# Patient Record
Sex: Male | Born: 1940 | Race: White | Hispanic: No | Marital: Married | Smoking: Former smoker
Health system: Southern US, Community
[De-identification: ages and names within clinical notes are randomized; demographics above are authoritative.]

## PROBLEM LIST (undated history)

## (undated) DIAGNOSIS — E785 Hyperlipidemia, unspecified: Secondary | ICD-10-CM

## (undated) DIAGNOSIS — R55 Syncope and collapse: Secondary | ICD-10-CM

## (undated) DIAGNOSIS — G4733 Obstructive sleep apnea (adult) (pediatric): Secondary | ICD-10-CM

## (undated) HISTORY — DX: Obstructive sleep apnea (adult) (pediatric): G47.33

## (undated) HISTORY — DX: Hyperlipidemia, unspecified: E78.5

## (undated) HISTORY — DX: Syncope and collapse: R55

---

## 1999-07-22 ENCOUNTER — Emergency Department (HOSPITAL_COMMUNITY): Admission: EM | Admit: 1999-07-22 | Discharge: 1999-07-22 | Payer: Self-pay | Admitting: Emergency Medicine

## 1999-07-22 ENCOUNTER — Encounter: Payer: Self-pay | Admitting: Cardiology

## 2000-12-26 ENCOUNTER — Encounter: Payer: Self-pay | Admitting: Family Medicine

## 2000-12-26 ENCOUNTER — Encounter: Admission: RE | Admit: 2000-12-26 | Discharge: 2000-12-26 | Payer: Self-pay | Admitting: Family Medicine

## 2002-01-04 ENCOUNTER — Emergency Department (HOSPITAL_COMMUNITY): Admission: EM | Admit: 2002-01-04 | Discharge: 2002-01-04 | Payer: Self-pay | Admitting: Emergency Medicine

## 2010-01-21 ENCOUNTER — Inpatient Hospital Stay (HOSPITAL_COMMUNITY)
Admission: EM | Admit: 2010-01-21 | Discharge: 2010-01-22 | Payer: Self-pay | Source: Home / Self Care | Attending: Cardiovascular Disease | Admitting: Cardiovascular Disease

## 2010-01-26 LAB — CBC
HCT: 41.4 % (ref 39.0–52.0)
HCT: 38.4 % — ABNORMAL LOW (ref 39.0–52.0)
Hemoglobin: 13.5 g/dL (ref 13.0–17.0)
Hemoglobin: 12.3 g/dL — ABNORMAL LOW (ref 13.0–17.0)
MCH: 25.5 pg — ABNORMAL LOW (ref 26.0–34.0)
MCH: 25.1 pg — ABNORMAL LOW (ref 26.0–34.0)
MCHC: 32.6 g/dL (ref 30.0–36.0)
MCHC: 32 g/dL (ref 30.0–36.0)
MCV: 78.3 fL (ref 78.0–100.0)
MCV: 78.4 fL (ref 78.0–100.0)
Platelets: 244 10*3/uL (ref 150–400)
RBC: 5.29 MIL/uL (ref 4.22–5.81)
RDW: 13.8 % (ref 11.5–15.5)
RDW: 13.9 % (ref 11.5–15.5)
WBC: 6.9 10*3/uL (ref 4.0–10.5)

## 2010-01-26 LAB — CARDIAC PANEL(CRET KIN+CKTOT+MB+TROPI)
Relative Index: INVALID (ref 0.0–2.5)
Troponin I: 0.01 ng/mL (ref 0.00–0.06)

## 2010-01-26 LAB — CK TOTAL AND CKMB (NOT AT ARMC)
CK, MB: 2.8 ng/mL (ref 0.3–4.0)
Relative Index: INVALID (ref 0.0–2.5)
Total CK: 57 U/L (ref 7–232)

## 2010-01-26 LAB — DIFFERENTIAL
Basophils Absolute: 0 10*3/uL (ref 0.0–0.1)
Basophils Relative: 1 % (ref 0–1)
Eosinophils Absolute: 0.2 10*3/uL (ref 0.0–0.7)
Eosinophils Relative: 4 % (ref 0–5)
Lymphocytes Relative: 41 % (ref 12–46)
Lymphs Abs: 2.8 10*3/uL (ref 0.7–4.0)
Monocytes Absolute: 0.4 10*3/uL (ref 0.1–1.0)
Monocytes Relative: 6 % (ref 3–12)
Neutro Abs: 3.4 10*3/uL (ref 1.7–7.7)
Neutrophils Relative %: 49 % (ref 43–77)

## 2010-01-26 LAB — POCT I-STAT, CHEM 8
BUN: 25 mg/dL — ABNORMAL HIGH (ref 6–23)
Calcium, Ion: 1.01 mmol/L — ABNORMAL LOW (ref 1.12–1.32)
Chloride: 101 mEq/L (ref 96–112)
Creatinine, Ser: 1.1 mg/dL (ref 0.4–1.5)
Glucose, Bld: 103 mg/dL — ABNORMAL HIGH (ref 70–99)
HCT: 42 % (ref 39.0–52.0)
Hemoglobin: 14.3 g/dL (ref 13.0–17.0)
Potassium: 4.1 mEq/L (ref 3.5–5.1)
Sodium: 138 mEq/L (ref 135–145)
TCO2: 31 mmol/L (ref 0–100)

## 2010-01-26 LAB — HEMOGLOBIN A1C
Hgb A1c MFr Bld: 6.3 % — ABNORMAL HIGH (ref <5.7)
Mean Plasma Glucose: 134 mg/dL — ABNORMAL HIGH (ref <117)

## 2010-01-26 LAB — BRAIN NATRIURETIC PEPTIDE: Pro B Natriuretic peptide (BNP): <30 pg/mL (ref 0.0–100.0)

## 2010-01-26 LAB — POCT CARDIAC MARKERS
CKMB, poc: 1.3 ng/mL (ref 1.0–8.0)
Troponin i, poc: <0.05 ng/mL (ref 0.00–0.09)

## 2010-01-26 LAB — LIPID PANEL
Triglycerides: 97 mg/dL (ref <150)
VLDL: 19 mg/dL (ref 0–40)

## 2010-01-26 LAB — PROTIME-INR
INR: 0.88 (ref 0.00–1.49)
Prothrombin Time: 12.1 seconds (ref 11.6–15.2)

## 2010-01-26 LAB — TSH: TSH: 3.404 u[IU]/mL (ref 0.350–4.500)

## 2010-01-26 LAB — TROPONIN I: Troponin I: 0.01 ng/mL (ref 0.00–0.06)

## 2010-02-04 NOTE — Discharge Summary (Signed)
  Patrick Carroll, Patrick Carroll              ACCOUNT NO.:  [id number]  MEDICAL RECORD NO.:  [id number]          PATIENT TYPE:  INP  LOCATION:  3704                         FACILITY:  [facility]  PHYSICIAN:  Nicki Guadalajara, M.D.     DATE OF BIRTH:  18-Sep-1940  DATE OF ADMISSION:  01/21/2010 DATE OF DISCHARGE:  01/22/2010                              DISCHARGE SUMMARY   DISCHARGE DIAGNOSES: 1. Chest pain, negative myocardial infarction.     a.     Normal nuclear stress test, Lexiscan, Myoview with ejection      fraction of 65%. 2. Dyslipidemia. 3. Hypertension, treated and controlled. 4. Gastroesophageal reflux disease.  DISCHARGE CONDITION:  Improved.  PROCEDURES:  None.  DISCHARGE INSTRUCTIONS:  Activity as tolerated.  Heart-healthy diet. Follow up with Dr. Julieanne Manson, [facility] in 3-4 weeks, February 12, 2010, at 8:45 a.m.  DISCHARGE MEDICATIONS:  See medication reconciliation sheet, but nochanges were made except he has sublingual nitro as needed.  HOSPITAL COURSE:  A 70 year old white married male whose wife is a patient of Dr. Fredirick Maudlin, primary care patient of Dr. Marjory Lies, presented to [facility] Emergency Room with new exertional chest pain. Pressure that awoke him at 3 a.m. on the day of admission, so not only exertional, but also rest pain.  It radiated to his jaw, describes 1- 2/10 pain, lasted 20 minutes.  No associated nausea, shortness of breath, or diaphoresis.  The patient was brought in and ruled out for MI.  Stress test was ordered and he underwent without complications and was negative for ischemia.  Dr. Clarene Duke saw him after the stress test, felt he was stable for discharge.  LABORATORY DATA:  Hemoglobin 12.3, hematocrit 38.4, WBC 7.5, and platelets 217.  Protime 12.1, INR 0.88.  Sodium 138, potassium 4.1, chloride 101, glucose 103, BUN 25, creatinine 1.1.  Hemoglobin A1c 6.3. CK 31-57, MB 1.5, troponin I 0.01-0.02, myoglobin 48.7.  BNP less  than 30.  Total cholesterol 173, triglycerides 97, HDL 51, LDL 103.  TSH 3.404.  RADIOLOGY:  Chest x-ray; no acute cardiopulmonary process, well- appearing hilar linear atelectasis on the left on nuclear stress test. No reversible ischemia or infarction, normal wall motion, EF 65%.  EKG; sinus rhythm, left axis deviation, no acute changes.  The patient is stable and ready for discharge and will follow up as instructed.     Darcella Gasman. Annie Paras, N.P.   ______________________________ Nicki Guadalajara, M.D.    LRI/MEDQ  D:  01/22/2010  T:  01/23/2010  Job:  [id number]  cc:   Marjory Lies, M.D.  Electronically Signed by Nada Boozer N.P. on 01/23/2010 07:09:16 PM Electronically Signed by Nicki Guadalajara M.D. on 02/04/2010 02:38:30 PM

## 2011-06-02 ENCOUNTER — Emergency Department (HOSPITAL_COMMUNITY): Payer: Medicare Other

## 2011-06-02 ENCOUNTER — Encounter (HOSPITAL_COMMUNITY): Payer: Self-pay | Admitting: Radiology

## 2011-06-02 ENCOUNTER — Emergency Department (HOSPITAL_COMMUNITY)
Admission: EM | Admit: 2011-06-02 | Discharge: 2011-06-02 | Disposition: A | Discharge status: Home / Self Care | Payer: Medicare Other | Attending: Emergency Medicine | Admitting: Emergency Medicine

## 2011-06-02 DIAGNOSIS — R110 Nausea: Secondary | ICD-10-CM | POA: Insufficient documentation

## 2011-06-02 DIAGNOSIS — R55 Syncope and collapse: Secondary | ICD-10-CM

## 2011-06-02 DIAGNOSIS — Z79899 Other long term (current) drug therapy: Secondary | ICD-10-CM | POA: Insufficient documentation

## 2011-06-02 DIAGNOSIS — J3489 Other specified disorders of nose and nasal sinuses: Secondary | ICD-10-CM | POA: Insufficient documentation

## 2011-06-02 DIAGNOSIS — R42 Dizziness and giddiness: Secondary | ICD-10-CM | POA: Insufficient documentation

## 2011-06-02 DIAGNOSIS — R05 Cough: Secondary | ICD-10-CM | POA: Insufficient documentation

## 2011-06-02 DIAGNOSIS — R5381 Other malaise: Secondary | ICD-10-CM | POA: Insufficient documentation

## 2011-06-02 DIAGNOSIS — R509 Fever, unspecified: Secondary | ICD-10-CM | POA: Insufficient documentation

## 2011-06-02 DIAGNOSIS — R059 Cough, unspecified: Secondary | ICD-10-CM | POA: Insufficient documentation

## 2011-06-02 DIAGNOSIS — Z7982 Long term (current) use of aspirin: Secondary | ICD-10-CM | POA: Insufficient documentation

## 2011-06-02 DIAGNOSIS — R231 Pallor: Secondary | ICD-10-CM | POA: Insufficient documentation

## 2011-06-02 DIAGNOSIS — R61 Generalized hyperhidrosis: Secondary | ICD-10-CM | POA: Insufficient documentation

## 2011-06-02 DIAGNOSIS — R109 Unspecified abdominal pain: Secondary | ICD-10-CM | POA: Insufficient documentation

## 2011-06-02 DIAGNOSIS — R404 Transient alteration of awareness: Secondary | ICD-10-CM | POA: Insufficient documentation

## 2011-06-02 LAB — COMPREHENSIVE METABOLIC PANEL
AST: 16 U/L (ref 0–37)
Albumin: 3 g/dL — ABNORMAL LOW (ref 3.5–5.2)
Chloride: 100 mEq/L (ref 96–112)
Creatinine, Ser: 1.15 mg/dL (ref 0.50–1.35)
Total Bilirubin: 0.3 mg/dL (ref 0.3–1.2)
Total Protein: 5.8 g/dL — ABNORMAL LOW (ref 6.0–8.3)

## 2011-06-02 LAB — URINALYSIS, ROUTINE W REFLEX MICROSCOPIC
Glucose, UA: NEGATIVE mg/dL
Specific Gravity, Urine: 1.024 (ref 1.005–1.030)
pH: 6.5 (ref 5.0–8.0)

## 2011-06-02 LAB — CBC
Hemoglobin: 10.3 g/dL — ABNORMAL LOW (ref 13.0–17.0)
MCH: 24.6 pg — ABNORMAL LOW (ref 26.0–34.0)
MCV: 76.8 fL — ABNORMAL LOW (ref 78.0–100.0)
Platelets: 200 10*3/uL (ref 150–400)
RBC: 4.18 MIL/uL — ABNORMAL LOW (ref 4.22–5.81)
WBC: 10.6 10*3/uL — ABNORMAL HIGH (ref 4.0–10.5)

## 2011-06-02 LAB — URINE MICROSCOPIC-ADD ON

## 2011-06-02 LAB — DIFFERENTIAL
Basophils Absolute: 0 10*3/uL (ref 0.0–0.1)
Basophils Relative: 0 % (ref 0–1)
Monocytes Absolute: 0.4 10*3/uL (ref 0.1–1.0)
Neutro Abs: 9.5 10*3/uL — ABNORMAL HIGH (ref 1.7–7.7)
Neutrophils Relative %: 90 % — ABNORMAL HIGH (ref 43–77)

## 2011-06-02 MED ORDER — MAGNESIUM CITRATE PO SOLN
1.0000 | Freq: Once | ORAL | Status: AC
  Administered 2011-06-02: 1 via ORAL
  Filled 2011-06-02: qty 296

## 2011-06-02 MED ORDER — SODIUM CHLORIDE 0.9 % IV BOLUS (SEPSIS)
1000.0000 mL | Freq: Once | INTRAVENOUS | Status: AC
  Administered 2011-06-02: 1000 mL via INTRAVENOUS

## 2011-06-02 MED ORDER — IOHEXOL 300 MG/ML  SOLN
20.0000 mL | INTRAMUSCULAR | Status: AC
  Administered 2011-06-02 (×2): 20 mL via ORAL

## 2011-06-02 MED ORDER — IOHEXOL 300 MG/ML  SOLN
100.0000 mL | Freq: Once | INTRAMUSCULAR | Status: AC | PRN
  Administered 2011-06-02: 100 mL via INTRAVENOUS

## 2011-06-02 MED ORDER — ONDANSETRON HCL 4 MG/2ML IJ SOLN
INTRAMUSCULAR | Status: AC
  Filled 2011-06-02: qty 4

## 2011-06-02 NOTE — ED Provider Notes (Signed)
History     CSN: [id number]  Arrival date & time 06/02/11  1553   First MD Initiated Contact with Patient 06/02/11 1607      Chief Complaint  Patient presents with  . 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  He states they told him he was out less than a minute.  He reports that he then began to feel nauseous and diaphoretic.  He states that even in the ambulance he continued with the nausea.  He reports now he has crampy lower abdominal pain.  He currently denies nausea.  He states no headache, chest pain, shortness of breath, diarrhea, vomiting.  He states that this weekend he had a head cold that then went into his chest with yellow sputum production, fever and cough.  He reports that he began feeling better from this on Monday.  He states no other symptoms with this.  Patient is a 71 y.o. male presenting with syncope. The history is provided by the patient and the spouse. No language interpreter was used.  Loss of Consciousness This is a new problem. The current episode started today. The problem occurs intermittently. The problem has been resolved. Associated symptoms include abdominal pain, congestion, coughing, diaphoresis, a fever, nausea and weakness. Pertinent negatives include no anorexia, arthralgias, change in bowel habit, chest pain, chills, fatigue, headaches, joint swelling, myalgias, neck pain, numbness, rash, sore throat, swollen glands, urinary symptoms, vertigo, visual change or vomiting. The symptoms are aggravated by nothing. He has tried rest and lying down for the symptoms. The treatment  provided moderate relief.    No past medical history on file.  No past surgical history on file.  No family history on file.  History  Substance Use Topics  . Smoking status: Not on file  . Smokeless tobacco: Not on file  . Alcohol Use: Not on file      Review of Systems  Constitutional: Positive for fever and diaphoresis. Negative for chills and fatigue.  HENT: Positive for congestion. Negative for sore throat and neck pain.   Respiratory: Positive for cough.   Cardiovascular: Positive for syncope. Negative for chest pain.  Gastrointestinal: Positive for nausea and abdominal pain. Negative for vomiting, anorexia and change in bowel habit.  Musculoskeletal: Negative for myalgias, joint swelling and arthralgias.  Skin: Negative for rash.  Neurological: Positive for weakness. Negative for vertigo, numbness and headaches.  All other systems reviewed and are negative.    Allergies  Penicillins  Home Medications   Current Outpatient Rx  Name Route Sig Dispense Refill  . ASPIRIN 81 MG PO CHEW Oral Chew 81 mg by mouth daily.    . B COMPLEX-C PO TABS Oral Take 1 tablet by mouth every other day.    Marland Kitchen CANDESARTAN CILEXETIL 16 MG PO TABS Oral Take 16 mg by mouth daily.    . ADULT MULTIVITAMIN W/MINERALS CH Oral Take 1 tablet by mouth daily.    Marland Kitchen PANTOPRAZOLE SODIUM 40 MG PO TBEC Oral Take 40 mg by mouth 2 (two) times daily.    Marland Kitchen PHENYLEPH-SHARK LIV OIL-MO-PET 0.25-3-14-71.9 % RE OINT Rectal Place 1 application rectally 2 (two)  times daily as needed. For discomfort    . PRAVASTATIN SODIUM 40 MG PO TABS Oral Take 40 mg by mouth daily.      BP 104/63  Pulse 65  Temp 97.7 F (36.5 C)  Resp 17  SpO2 96%  Physical Exam  Nursing note and vitals reviewed. Constitutional: He is oriented to person, place, and time. He appears well-developed and well-nourished. No distress.  HENT:  Head: Normocephalic and atraumatic.  Right Ear: External ear normal.  Left Ear: External ear  normal.  Nose: Nose normal.  Mouth/Throat: Oropharynx is clear and moist. No oropharyngeal exudate.  Eyes: Conjunctivae are normal. Pupils are equal, round, and reactive to light. No scleral icterus.  Neck: Normal range of motion. Neck supple. No JVD present. No tracheal deviation present.  Cardiovascular: Normal rate, regular rhythm, normal heart sounds and intact distal pulses.  Exam reveals no gallop and no friction rub.   No murmur heard. Pulmonary/Chest: Effort normal and breath sounds normal. No respiratory distress. He has no wheezes. He has no rales. He exhibits no tenderness.  Abdominal: Soft. Bowel sounds are normal. He exhibits no distension and no mass. There is tenderness in the suprapubic area. There is no rebound and no guarding.    Musculoskeletal: Normal range of motion. He exhibits no edema and no tenderness.  Neurological: He is alert and oriented to person, place, and time. No cranial nerve deficit. He exhibits normal muscle tone. Coordination normal.  Skin: Skin is warm and dry. No rash noted. No erythema. There is pallor.  Psychiatric: He has a normal mood and affect. His behavior is normal. Judgment and thought content normal.    ED Course  Procedures (including critical care time)   Labs Reviewed  CBC  DIFFERENTIAL  COMPREHENSIVE METABOLIC PANEL  LIPASE, BLOOD  URINALYSIS, ROUTINE W REFLEX MICROSCOPIC   No results found.  Results for orders placed during the hospital encounter of 06/02/11  CBC      Component Value Range   WBC 10.6 (*) 4.0 - 10.5 (K/uL)   RBC 4.18 (*) 4.22 - 5.81 (MIL/uL)   Hemoglobin 10.3 (*) 13.0 - 17.0 (g/dL)   HCT 16.1 (*) 09.6 - 52.0 (%)   MCV 76.8 (*) 78.0 - 100.0 (fL)   MCH 24.6 (*) 26.0 - 34.0 (pg)   MCHC 32.1  30.0 - 36.0 (g/dL)   RDW 04.5  40.9 - 81.1 (%)   Platelets 200  150 - 400 (K/uL)  DIFFERENTIAL      Component Value Range   Neutrophils Relative 90 (*) 43 - 77 (%)   Neutro Abs 9.5 (*) 1.7 - 7.7 (K/uL)   Lymphocytes  Relative 6 (*) 12 - 46 (%)   Lymphs Abs 0.6 (*) 0.7 - 4.0 (K/uL)   Monocytes Relative 4  3 - 12 (%)   Monocytes Absolute 0.4  0.1 - 1.0 (K/uL)   Eosinophils Relative 1  0 - 5 (%)   Eosinophils Absolute 0.1  0.0 - 0.7 (K/uL)   Basophils Relative 0  0 - 1 (%)   Basophils Absolute 0.0  0.0 - 0.1 (K/uL)  COMPREHENSIVE METABOLIC PANEL      Component Value Range   Sodium 136  135 - 145 (mEq/L)   Potassium 4.4  3.5 - 5.1 (mEq/L)   Chloride 100  96 - 112 (mEq/L)   CO2 25  19 - 32 (mEq/L)   Glucose, Bld 116 (*) 70 - 99 (mg/dL)   BUN 18  6 - 23 (mg/dL)  Creatinine, Ser 1.15  0.50 - 1.35 (mg/dL)   Calcium 7.6 (*) 8.4 - 10.5 (mg/dL)   Total Protein 5.8 (*) 6.0 - 8.3 (g/dL)   Albumin 3.0 (*) 3.5 - 5.2 (g/dL)   AST 16  0 - 37 (U/L)   ALT 14  0 - 53 (U/L)   Alkaline Phosphatase 67  39 - 117 (U/L)   Total Bilirubin 0.3  0.3 - 1.2 (mg/dL)   GFR calc non Af Amer 63 (*) >90 (mL/min)   GFR calc Af Amer 73 (*) >90 (mL/min)  LIPASE, BLOOD      Component Value Range   Lipase 19  11 - 59 (U/L)  URINALYSIS, ROUTINE W REFLEX MICROSCOPIC      Component Value Range   Color, Urine AMBER (*) YELLOW    APPearance HAZY (*) CLEAR    Specific Gravity, Urine 1.024  1.005 - 1.030    pH 6.5  5.0 - 8.0    Glucose, UA NEGATIVE  NEGATIVE (mg/dL)   Hgb urine dipstick NEGATIVE  NEGATIVE    Bilirubin Urine SMALL (*) NEGATIVE    Ketones, ur 15 (*) NEGATIVE (mg/dL)   Protein, ur NEGATIVE  NEGATIVE (mg/dL)   Urobilinogen, UA 1.0  0.0 - 1.0 (mg/dL)   Nitrite NEGATIVE  NEGATIVE    Leukocytes, UA SMALL (*) NEGATIVE   URINE MICROSCOPIC-ADD ON      Component Value Range   Squamous Epithelial / LPF RARE  RARE    WBC, UA 0-2  <3 (WBC/hpf)   Bacteria, UA RARE  RARE    Casts HYALINE CASTS (*) NEGATIVE    Crystals CA OXALATE CRYSTALS (*) NEGATIVE    Urine-Other MUCOUS PRESENT    POCT I-STAT TROPONIN I      Component Value Range   Troponin i, poc 0.01  0.00 - 0.08 (ng/mL)   Comment 3            Dg Chest 2  View  06/02/2011  *RADIOLOGY REPORT*  Clinical Data: Loss of consciousness today.  Nausea.  Recent upper respiratory infection.  CHEST - 2 VIEW  Comparison: Portable chest 01/21/2010.  Findings: Left hemidiaphragm is slightly elevated.  The heart size is normal.  The lungs are clear.  Mild gaseous distention is noted involving both large and small bowel.  IMPRESSION:  1.  No acute cardiopulmonary disease. 2.  Nonspecific bowel dilation of large and small bowel.  Question ileus.  Original Report Authenticated By: Jamesetta Orleans. MATTERN, M.D.   Ct Abdomen Pelvis W Contrast  06/02/2011  *RADIOLOGY REPORT*  Clinical Data: Fever, nausea, diarrhea.  CT ABDOMEN AND PELVIS WITH CONTRAST  Technique:  Multidetector CT imaging of the abdomen and pelvis was performed following the standard protocol during bolus administration of intravenous contrast.  Contrast: OMNIPAQUE IOHEXOL 300 MG/ML  SOLN  Comparison: Chest x-ray 06/02/2011  Findings: Mild scarring at the lung bases without effusion or infiltrate.  Unremarkable liver, spleen, gallbladder, pancreas, and adrenal glands.  Marked gastric distention consistent with ileus. No evidence for gastric obstruction.  Moderately large stool burden in the right colon with nonobstructive ileus of the redundant transverse and descending colon.  Sigmoid colon and rectum are compressed.  No small bowel distention or obstruction.  Normal kidneys.  Atheromatous change of the aorta is nonaneurysmal. No retroperitoneal adenopathy.  No appendiceal inflammation.  No free fluid or free air.  No acute osseous findings.  Mild scoliosis.  Mild degenerative disc disease in the lower lumbar spine.  Normal bladder.  Atrophic uterus.  No adnexal masses.  IMPRESSION: Findings consistent with gastric and colonic ileus.  Moderate stool burden suggesting constipation. No significant obstruction or visible free air.  Original Report Authenticated By: Elsie Stain, M.D.     Vasovagal  Syncope   MDM  5:35 PM Patient with noted dilitation of small and large bowel on x-ray, because of this as well as the nausea and diarrhea here, the plan will be to get a CT scan of the abdomen here to rule out an ileus.  10:04 PM CT results returned - patient with gastric and colonic ileus likely related to chronic constipation.  I believe that with the dilitation, the patient likely vasovagal syncope.  Sees Dr. Matthias Hughs with GI, he will follow up with him, will be given bottle of magnesium citrate solution here.       Izola Price [place], [place] 06/02/11 2206

## 2011-06-02 NOTE — ED Notes (Signed)
Called radiology to inform them that patient has finished drinking contrast

## 2011-06-02 NOTE — ED Notes (Signed)
Patient transported to X-ray 

## 2011-06-02 NOTE — ED Notes (Signed)
Patient transported to CT 

## 2011-06-02 NOTE — Discharge Instructions (Signed)
Syncope You have had a fainting (syncopal) spell. A fainting episode is a sudden, short-lived loss of consciousness. It results in complete recovery. It occurs because there has been a temporary shortage of oxygen and/or sugar (glucose) to the brain. CAUSES   Blood pressure pills and other medications that may lower blood pressure below normal. Sudden changes in posture (sudden standing).   Over-medication. Take your medications as directed.   Standing too long. This can cause blood to pool in the legs.   Seizure disorders.   Low blood sugar (hypoglycemia) of diabetes. This more commonly causes coma.   Bearing down to go to the bathroom. This can cause your blood pressure to rise suddenly. Your body compensates by making the blood pressure too low when you stop bearing down.   Hardening of the arteries where the brain temporarily does not receive enough blood.   Irregular heart beat and circulatory problems.   Fear, emotional distress, injury, sight of blood, or illness.  Your caregiver will send you home if the syncope was from non-worrisome causes (benign). Depending on your age and health, you may stay to be monitored and observed. If you return home, have someone stay with you if your caregiver feels that is desirable. It is very important to keep all follow-up referrals and appointments in order to properly manage this condition. This is a serious problem which can lead to serious illness and death if not carefully managed.  WARNING: Do not drive or operate machinery until your caregiver feels that it is safe for you to do so. SEEK IMMEDIATE MEDICAL CARE IF:   You have another fainting episode or faint while lying or sitting down. DO NOT DRIVE YOURSELF. Call 911 if no other help is available.   You have chest pain, are feeling sick to your stomach (nausea), vomiting or abdominal pain.   You have an irregular heartbeat or one that is very fast (pulse over 120 beats per minute).    You have a loss of feeling in some part of your body or lose movement in your arms or legs.   You have difficulty with speech, confusion, severe weakness, or visual problems.   You become sweaty and/or feel light headed.  Make sure you are rechecked as instructed. Document Released: 12/21/2004 Document Revised: 12/10/2010 Document Reviewed: 08/11/2006 [employer] Patient Information 2012 [employer]. 

## 2011-06-02 NOTE — ED Provider Notes (Signed)
5:26 PM  Date: 06/02/2011  Rate: 62  Rhythm: normal sinus rhythm  QRS Axis: left  Intervals: normal QRS:  Early precordial R./S. transition; poor R wave progression in the lateral precordial leads suggests possible old lateral myocardial infarction.  ST/T Wave abnormalities: normal  Conduction Disutrbances:none  Narrative Interpretation:  Abnormal EKG  Old EKG Reviewed: unchanged    Carleene Cooper III, MD 06/02/11 1728

## 2011-06-02 NOTE — ED Notes (Signed)
PA at bedside.

## 2011-06-02 NOTE — ED Notes (Signed)
Pt just returned from CT.

## 2011-06-02 NOTE — ED Notes (Signed)
Pharmacy notified for mag citrate

## 2011-06-02 NOTE — ED Notes (Signed)
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 Pt then had a syncopal episode. Pt did not hit head or fall. Pt has been nauseated and diaphoretic. Pt received 8mg  of zofran enroute. Pt has been congested, with yellow sputum for about 1 week. Pt reported a fever that broke on Monday night. Pt received of normal saline. 18g in left forearm. BP 102/60. HR 60. CBG 112.

## 2011-06-04 NOTE — ED Provider Notes (Signed)
Medical screening examination/treatment/procedure(s) were conducted as a shared visit with non-physician practitioner(s) and myself.  I personally evaluated the patient during the encounter Mr. 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  His exam was normal.  He had a lot of bowel gas on his chest x-ray and so CT of abdomen/pelvis was done.  Fortunately, no serious finding was encountered. He had fully recovered from his fainting spell at the time of discharge.  Carleene Cooper III, MD 06/04/11 1055

## 2011-07-06 ENCOUNTER — Other Ambulatory Visit: Payer: Self-pay | Admitting: Gastroenterology

## 2011-07-06 DIAGNOSIS — Z1211 Encounter for screening for malignant neoplasm of colon: Secondary | ICD-10-CM

## 2011-07-07 ENCOUNTER — Ambulatory Visit
Admission: RE | Admit: 2011-07-07 | Discharge: 2011-07-07 | Disposition: A | Discharge status: Home / Self Care | Payer: Medicare Other | Source: Ambulatory Visit | Attending: Gastroenterology | Admitting: Gastroenterology

## 2011-07-07 DIAGNOSIS — Z1211 Encounter for screening for malignant neoplasm of colon: Secondary | ICD-10-CM

## 2016-05-10 ENCOUNTER — Emergency Department (HOSPITAL_COMMUNITY)
Admission: EM | Admit: 2016-05-10 | Discharge: 2016-05-10 | Disposition: A | Discharge status: Home / Self Care | Payer: Medicare Other | Attending: Emergency Medicine | Admitting: Emergency Medicine

## 2016-05-10 DIAGNOSIS — Z7982 Long term (current) use of aspirin: Secondary | ICD-10-CM | POA: Insufficient documentation

## 2016-05-10 DIAGNOSIS — M25511 Pain in right shoulder: Secondary | ICD-10-CM | POA: Diagnosis not present

## 2016-05-10 DIAGNOSIS — R55 Syncope and collapse: Secondary | ICD-10-CM | POA: Diagnosis present

## 2016-05-10 LAB — CBC
HCT: 39.4 % (ref 39.0–52.0)
HEMOGLOBIN: 12.8 g/dL — AB (ref 13.0–17.0)
MCH: 26.4 pg (ref 26.0–34.0)
MCHC: 32.5 g/dL (ref 30.0–36.0)
MCV: 81.2 fL (ref 78.0–100.0)
PLATELETS: 184 10*3/uL (ref 150–400)
RBC: 4.85 MIL/uL (ref 4.22–5.81)
RDW: 13.8 % (ref 11.5–15.5)
WBC: 7.1 10*3/uL (ref 4.0–10.5)

## 2016-05-10 LAB — URINALYSIS, ROUTINE W REFLEX MICROSCOPIC
BILIRUBIN URINE: NEGATIVE
GLUCOSE, UA: NEGATIVE mg/dL
HGB URINE DIPSTICK: NEGATIVE
Ketones, ur: NEGATIVE mg/dL
Leukocytes, UA: NEGATIVE
Nitrite: NEGATIVE
Protein, ur: NEGATIVE mg/dL
SPECIFIC GRAVITY, URINE: 1.016 (ref 1.005–1.030)
pH: 7 (ref 5.0–8.0)

## 2016-05-10 LAB — BASIC METABOLIC PANEL
Anion gap: 7 (ref 5–15)
BUN: 15 mg/dL (ref 6–20)
CALCIUM: 8.7 mg/dL — AB (ref 8.9–10.3)
CO2: 30 mmol/L (ref 22–32)
CREATININE: 0.89 mg/dL (ref 0.61–1.24)
Chloride: 103 mmol/L (ref 101–111)
GFR calc Af Amer: >60 mL/min (ref >60)
GFR calc non Af Amer: >60 mL/min (ref >60)
GLUCOSE: 105 mg/dL — AB (ref 65–99)
Potassium: 4.2 mmol/L (ref 3.5–5.1)
Sodium: 140 mmol/L (ref 135–145)

## 2016-05-10 LAB — TROPONIN I: Troponin I: <0.03 ng/mL (ref <0.03)

## 2016-05-10 MED ORDER — IBUPROFEN 200 MG PO TABS
600.0000 mg | ORAL_TABLET | Freq: Once | ORAL | Status: AC
  Administered 2016-05-10: 600 mg via ORAL
  Filled 2016-05-10: qty 1

## 2016-05-10 NOTE — ED Provider Notes (Signed)
[facility] DEPT Provider Note   CSN: [id number] Arrival date & time: 05/10/16  0810     History   Chief Complaint Chief Complaint  Patient presents with  . Near Syncope    HPI Patrick Carroll is a 76 y.o. male.  Patient reports he has been working outdoors in the heat the last couple of days. Today had not yet had breakfast, was doing some chores around the house. Sat down on the couch. Had onset of R shoulder pain, nausea, diaphoresis, lightheaded. No HA or vision changes. No CP or SOB. No palpitations. Denies recent f/c or infectious sx. No n/v/d. No melena or hematochezia. Wife is a retired [employer] scientist. She was able to get vitals on the patient - SBP 70s, HR 40s. Laid the patient down. Resolved after approx 10 minutes. EMS called. FS 150s. Now all symptoms completely resolved.  Of note, patient has had multiple prior vagal episodes in the past with similar course.    The history is provided by the patient and the spouse.  Near Syncope  This is a new problem. The current episode started less than 1 hour ago. The problem occurs rarely. The problem has been resolved. Pertinent negatives include no chest pain, no headaches and no shortness of breath. Nothing aggravates the symptoms. The symptoms are relieved by lying down.    No past medical history on file.  There are no active problems to display for this patient.   No past surgical history on file.     Home Medications    Prior to Admission medications   Medication Sig Start Date End Date Taking? Authorizing Provider  aspirin 81 MG chewable tablet Chew 81 mg by mouth daily.   Yes [provider]  B Complex-C (B-COMPLEX WITH VITAMIN C) tablet Take 1 tablet by mouth every other day.   Yes [provider]  Multiple Vitamin (MULITIVITAMIN WITH MINERALS) TABS Take 1 tablet by mouth daily.   Yes [provider]  tadalafil (CIALIS) 5 MG tablet Take 2.5 mg by mouth every other day.   Yes [provider]    Family History No family history on file.  Social History Social History  Substance Use Topics  . Smoking status: Not on file  . Smokeless tobacco: Not on file  . Alcohol use Not on file     Allergies   Penicillins   Review of Systems Review of Systems  Constitutional: Negative for chills and fever.  Eyes: Negative for visual disturbance.  Respiratory: Negative for cough and shortness of breath.   Cardiovascular: Positive for near-syncope. Negative for chest pain and palpitations.  Gastrointestinal: Positive for nausea. Negative for blood in stool, diarrhea and vomiting.  Genitourinary: Negative for dysuria.  Musculoskeletal: Negative for back pain.  Neurological: Negative for headaches.  All other systems reviewed and are negative.    Physical Exam Updated Vital Signs BP 110/67   Pulse 63   Temp 97.6 F (36.4 C) (Oral)   Resp 16   SpO2 96%   Physical Exam  Constitutional: He appears well-developed and well-nourished.  HENT:  Head: Normocephalic and atraumatic.  Eyes: Conjunctivae are normal.  Neck: Neck supple.  Cardiovascular: Normal rate and regular rhythm.   No S3 No holosystolic murmur  Pulmonary/Chest: Effort normal and breath sounds normal. No respiratory distress.  Abdominal: Soft. There is no tenderness.  Musculoskeletal: He exhibits no edema.       Right lower leg: He exhibits no swelling and no edema.  Left lower leg: He exhibits no swelling and no edema.  R shoulder: No gross deformity. Mild TTP overlying anterior and lateral aspect of humeral head. Pain with aROM. Resolution of pain with pROM.  No effusion, warmth, erythema. NVI.   Neurological: He is alert. GCS eye subscore is 4. GCS verbal subscore is 5. GCS motor subscore is 6.  Skin: Skin is warm and dry.  Psychiatric: He has a normal mood and affect.  Nursing note and vitals reviewed.    ED Treatments / Results  Labs (all labs ordered are listed, but only  abnormal results are displayed) Labs Reviewed  BASIC METABOLIC PANEL - Abnormal; Notable for the following:       Result Value   Glucose, Bld 105 (*)    Calcium 8.7 (*)    All other components within normal limits  CBC - Abnormal; Notable for the following:    Hemoglobin 12.8 (*)    All other components within normal limits  URINALYSIS, ROUTINE W REFLEX MICROSCOPIC - Abnormal; Notable for the following:    APPearance HAZY (*)    All other components within normal limits  TROPONIN I  TROPONIN I    EKG  EKG Interpretation  Date/Time:  Monday May 10 2016 08:12:50 EDT Ventricular Rate:  58 PR Interval:    QRS Duration: 109 QT Interval:  423 QTC Calculation: 416 R Axis:   -2 Text Interpretation:  Sinus rhythm Abnormal R-wave progression, early transition ST-t wave abnormality No significant change since last tracing Abnormal ekg Confirmed by Carmin Muskrat  MD [phone]) on 05/10/2016 8:34:33 AM       Radiology No results found.  Procedures Procedures (including critical care time)  Medications Ordered in ED Medications  ibuprofen (ADVIL,MOTRIN) tablet 600 mg (600 mg Oral Given 05/10/16 1119)     Initial Impression / Assessment and Plan / ED Course  I have reviewed the triage vital signs and the nursing notes.  Pertinent labs & imaging results that were available during my care of the patient were reviewed by me and considered in my medical decision making (see chart for details).    EKG: Right sinus, regular; normal intervals; no ischemic ST abnormalities. Heart scores 3.  EKG with no ischemic findings or interval abnormalities. Heart scores 3. Delta troponin is negative. ACS. Chest x-ray without evidence of pneumothorax or pneumonia. Description of symptoms not consistent with aortic dissection or pulmonary embolism.  Believe the patient's pain in his right shoulder is likely muscular in nature. Pain virtually resolves after passive range of motion, and returns with  active range of motion. Denies any traumatic injuries. Did not feel the patient required imaging at this time. Doubt fracture. Encouraged him to use rest, ice, compression, elevation. Enouraged NSAIDs and Tylenol. Told him to pursue possible outpatient orthopedic follow-up should he have persistent pain in the next 1 week.  Given the patient's prior episodes ever similar to this, feel that the patient's near syncope was secondary to the pain in his shoulder. His orthostatic vital signs are negative here. Both the patient and his wife feel comfortable after workup here, and are comfortable with following up with her outpatient primary care physician.  Final Clinical Impressions(s) / ED Diagnoses   Final diagnoses:  Acute pain of right shoulder    New Prescriptions Discharge Medication List as of 05/10/2016  1:07 PM       Maryan Puls, MD 05/10/16 [id number]    Maryan Puls, MD 05/10/16 1629    Carmin Muskrat,  MD 05/12/16 2046

## 2016-05-10 NOTE — ED Notes (Signed)
Pt standing on side of bed using urinal. Tolerated well.

## 2016-05-10 NOTE — ED Triage Notes (Addendum)
Patient from home for near syncope.  Patient states he was performing normal morning routine when he felt sudden sharp right shoulder pain, sat down, became diaphoretic, and felt like he would have passed out of if he had been standing.  Patient reports wife took his blood pressure during this episode, 70/40.  EMS reports pressure 114/68, HR 64.  18g saline lock in left forearm.  Patient alert and oriented and in no apparent distress at this time.  Patient states he has a history of "vagal responses to pain", stating that he remembers having an ankle injury that, when palpated for assessment, he passed out immediately.

## 2020-09-18 ENCOUNTER — Other Ambulatory Visit: Payer: Self-pay | Admitting: Internal Medicine

## 2020-09-18 DIAGNOSIS — E7800 Pure hypercholesterolemia, unspecified: Secondary | ICD-10-CM

## 2020-10-09 ENCOUNTER — Ambulatory Visit
Admission: RE | Admit: 2020-10-09 | Discharge: 2020-10-09 | Disposition: A | Discharge status: Home / Self Care | Payer: Medicare Other | Source: Ambulatory Visit | Attending: Internal Medicine | Admitting: Internal Medicine

## 2020-10-09 DIAGNOSIS — E7800 Pure hypercholesterolemia, unspecified: Secondary | ICD-10-CM

## 2020-12-17 ENCOUNTER — Other Ambulatory Visit: Payer: Self-pay

## 2020-12-17 ENCOUNTER — Ambulatory Visit (INDEPENDENT_AMBULATORY_CARE_PROVIDER_SITE_OTHER): Payer: Medicare Other | Admitting: Dermatology

## 2020-12-17 ENCOUNTER — Encounter: Payer: Self-pay | Admitting: Dermatology

## 2020-12-17 DIAGNOSIS — L820 Inflamed seborrheic keratosis: Secondary | ICD-10-CM

## 2020-12-17 DIAGNOSIS — L570 Actinic keratosis: Secondary | ICD-10-CM | POA: Diagnosis not present

## 2020-12-17 DIAGNOSIS — Z1283 Encounter for screening for malignant neoplasm of skin: Secondary | ICD-10-CM | POA: Diagnosis not present

## 2020-12-17 DIAGNOSIS — D485 Neoplasm of uncertain behavior of skin: Secondary | ICD-10-CM

## 2020-12-17 NOTE — Patient Instructions (Signed)

## 2020-12-26 ENCOUNTER — Encounter: Payer: Self-pay | Admitting: Dermatology

## 2021-01-11 ENCOUNTER — Encounter: Payer: Self-pay | Admitting: Dermatology

## 2021-01-11 NOTE — Progress Notes (Signed)
° °  Follow-Up Visit   Subjective  Patrick Carroll is a 81 y.o. male who presents for the following: Annual Exam (Left jawline x months no healing,  no history of bcc scc or atypia ).  Annual examination, nonhealing spot on left cheek, several other crusts Location:  Duration:  Quality:  Associated Signs/Symptoms: Modifying Factors:  Severity:  Timing: Context:   Objective  Well appearing patient in no apparent distress; mood and affect are within normal limits. Scalp Full body skin check: No atypical pigmented lesions.  1 possible nonmelanoma skin cancer left cheek will be biopsied.  Left Buccal Cheek 8 mm focally eroded waxy pink crust, rule out superficial carcinoma     Right Parietal Scalp Gritty 6 mm pink crust    A full examination was performed including scalp, head, eyes, ears, nose, lips, neck, chest, axillae, abdomen, back, buttocks, bilateral upper extremities, bilateral lower extremities, hands, feet, fingers, toes, fingernails, and toenails. All findings within normal limits unless otherwise noted below.  Areas beneath undergarments not fully examined   Assessment & Plan    Screening exam for skin cancer Scalp  Annual skin examination.  Neoplasm of uncertain behavior of skin Left Buccal Cheek  Skin / nail biopsy Type of biopsy: tangential   Informed consent: discussed and consent obtained   Timeout: patient name, date of birth, surgical site, and procedure verified   Anesthesia: the lesion was anesthetized in a standard fashion   Anesthetic:  1% lidocaine w/ epinephrine 1-100,000 local infiltration Instrument used: flexible razor blade   Hemostasis achieved with: aluminum chloride and electrodesiccation   Outcome: patient tolerated procedure well   Post-procedure details: wound care instructions given    Specimen 1 - Surgical pathology Differential Diagnosis: bcc scc  Check Margins: No  AK (actinic keratosis) Right Parietal Scalp  Destruction  of lesion - Right Parietal Scalp Complexity: simple   Destruction method: cryotherapy   Informed consent: discussed and consent obtained   Timeout:  patient name, date of birth, surgical site, and procedure verified Lesion destroyed using liquid nitrogen: Yes   Cryotherapy cycles:  3 Outcome: patient tolerated procedure well with no complications   Post-procedure details: wound care instructions given        I, Lavonna Monarch, MD, have reviewed all documentation for this visit.  The documentation on 01/11/21 for the exam, diagnosis, procedures, and orders are all accurate and complete.

## 2021-03-11 ENCOUNTER — Other Ambulatory Visit: Payer: Self-pay

## 2021-03-11 ENCOUNTER — Ambulatory Visit (INDEPENDENT_AMBULATORY_CARE_PROVIDER_SITE_OTHER): Payer: Medicare Other | Admitting: Cardiovascular Disease

## 2021-03-11 ENCOUNTER — Encounter: Payer: Self-pay | Admitting: Cardiovascular Disease

## 2021-03-11 DIAGNOSIS — E785 Hyperlipidemia, unspecified: Secondary | ICD-10-CM | POA: Diagnosis not present

## 2021-03-11 DIAGNOSIS — R931 Abnormal findings on diagnostic imaging of heart and coronary circulation: Secondary | ICD-10-CM

## 2021-03-11 DIAGNOSIS — G4733 Obstructive sleep apnea (adult) (pediatric): Secondary | ICD-10-CM

## 2021-03-11 DIAGNOSIS — R011 Cardiac murmur, unspecified: Secondary | ICD-10-CM | POA: Diagnosis not present

## 2021-03-11 DIAGNOSIS — Z79899 Other long term (current) drug therapy: Secondary | ICD-10-CM

## 2021-03-11 DIAGNOSIS — Z9989 Dependence on other enabling machines and devices: Secondary | ICD-10-CM

## 2021-03-11 NOTE — Patient Instructions (Signed)
Medication Instructions:  ?The current medical regimen is effective;  continue present plan and medications as directed. Please refer to the Current Medication list given to you today.  ? ?*If you need a refill on your cardiac medications before your next appointment, please call your pharmacy* ? ?Lab Work:    ?FASTING LIPID AND Lpa AFTER TISOVEC APPT ? ?Testing/Procedures:  ?Echocardiogram - Your physician has requested that you have an echocardiogram. Echocardiography is a painless test that uses sound waves to create images of your heart. It provides your doctor with information about the size and shape of your heart and how well your heart?s chambers and valves are working. This procedure takes approximately one hour. There are no restrictions for this procedure. This will be performed at either our [facility] location - [address], [place] location [facility] 2nd floor. ? ?Follow-Up: ?Your next appointment:  3-4 month(s) In Person with [name], MD    ? ?At [facility], you and your health needs are our priority.  As part of our continuing mission to provide you with exceptional heart care, we have created designated Provider Care Teams.  These Care Teams include your primary Cardiologist (physician) and Advanced Practice Providers (APPs -  Physician Assistants and Nurse Practitioners) who all work together to provide you with the care you need, when you need it. ? ? ?

## 2021-03-11 NOTE — Progress Notes (Signed)
Cardiology Office Note    Date:  03/12/2021   ID:  Patrick Carroll, DOB [birth year], MRN [id number]  PCP:  [name], MD  Cardiologist:  [name], MD   New cardiology evaluation  History of Present Illness:  Patrick Carroll is a 81 y.o. male who is followed by Dr. [name] at [facility].  He admits to being very active and in October 2022 apparently had an episode of weakness and possible syncope on heavy exertion while walking climbing.  On one occasion 20 years previously he had a similar episode and was felt the hydration or possible vagal response.  Remotely, he had been on statin therapy.  He denies any episodes of chest pain or shortness of breath.  Laboratory on September 10, 2020 showed significant cholesterol elevation at 248 with HDL 46, LDL 186, triglycerides 82.  He was started on rosuvastatin 5 mg.  He was referred for a coronary artery calcium score which revealed an agate stent score of 517 representing 56 percentile based on age race and gender.  Calcification in the left main was 58.9, LAD 17.4, circumflex 56.4, and RCA 229.  He also was noted to have elevated left hemidiaphragm with juxta diaphragmatic atelectasis and this finding was similar to prior CT assessment from 2013.  He had a small hiatal hernia.  Repeat laboratory on January 23 showed improvement of LDL cholesterol to 105 and since that time his rosuvastatin dose was further titrated to 10 mg.  He continues to be active and rock climbs several times per month at [employer].  He has a history of obstructive sleep apnea and has been on CPAP therapy for 2 years with his DME company [employer].  He has been followed by a physician in [facility].  CPAP compliance is 100%.  He presents now for cardiology evaluation.  Medical history is notable for BPH, GERD, OSA he had COVID in 2022 treated with Paxlovid.  Past surgical history is notable for cataract surgery  Current Medications: Outpatient  Medications Prior to Visit  Medication Sig Dispense Refill   B Complex-C (B-COMPLEX WITH VITAMIN C) tablet Take 1 tablet by mouth every other day.     famotidine (PEPCID) 10 MG tablet Take 10 mg by mouth 2 (two) times daily.     Multiple Vitamin (MULITIVITAMIN WITH MINERALS) TABS Take 1 tablet by mouth daily.     psyllium (REGULOID) 0.52 g capsule Take 0.52 g by mouth daily.     Tadalafil (CIALIS) 2.5 MG TABS Take by mouth.     tadalafil (CIALIS) 5 MG tablet Take 2.5 mg by mouth every other day.     aspirin 81 MG chewable tablet Chew 81 mg by mouth daily. (Patient not taking: Reported on 12/17/2020)     rosuvastatin (CRESTOR) 5 MG tablet Take 5 mg by mouth daily.     No facility-administered medications prior to visit.     Allergies:   Penicillins   Social History   Socioeconomic History   Marital status: Married    Spouse name: Not on file   Number of children: Not on file   Years of education: Not on file   Highest education level: Not on file  Occupational History   Not on file  Tobacco Use   Smoking status: Former    Types: Cigarettes    Quit date: 1979    Years since quitting: 44.2   Smokeless tobacco: Never  Vaping Use   Vaping Use:  Never used  Substance and Sexual Activity   Alcohol use: Never   Drug use: Never   Sexual activity: Not on file  Other Topics Concern   Not on file  Social History Narrative   Not on file   Social Determinants of Health   Financial Resource Strain: Not on file  Food Insecurity: Not on file  Transportation Needs: Not on file  Physical Activity: Not on file  Stress: Not on file  Social Connections: Not on file    He was born in [place], [place].  He had been a paramedic for [employer].  He had remotely smoked and quit in 1975.  He is married for 60 years and has 3 children, and 7 grandchildren.  Family History: Family history is notable for both parents being deceased.  His mother died at age 76 and had previously  undergone CABG and had heart failure.  Father died at age 20 with Alzheimer's disease.  A brother died with: CVA.  He has 3 children.  ROS General: Negative; No fevers, chills, or night sweats;  HEENT: Negative; No changes in vision or hearing, sinus congestion, difficulty swallowing Pulmonary: Negative; No cough, wheezing, shortness of breath, hemoptysis Cardiovascular: See HPI GI: Negative; No nausea, vomiting, diarrhea, or abdominal pain GU: Negative; No dysuria, hematuria, or difficulty voiding Musculoskeletal: Negative; no myalgias, joint pain, or weakness Hematologic/Oncology: Negative; no easy bruising, bleeding Endocrine: Negative; no heat/cold intolerance; no diabetes Neuro: Negative; no changes in balance, headaches Skin: Negative; No rashes or skin lesions Psychiatric: Negative; No behavioral problems, depression Sleep: Positive for OSA on CPAP  Other comprehensive 14 point system review is negative.   PHYSICAL EXAM:   VS:  BP (!) 148/72    Pulse 61    Ht 5' 9.5" (1.765 m)    Wt 166 lb 3.2 oz (75.4 kg)    SpO2 99%    BMI 24.19 kg/m     Repeat blood pressure by me was 120/70.  Wt Readings from Last 3 Encounters:  03/11/21 166 lb 3.2 oz (75.4 kg)    General: Alert, oriented, no distress.  Appears younger than stated age Skin: normal turgor, no rashes, warm and dry HEENT: Normocephalic, atraumatic. Pupils equal round and reactive to light; sclera anicteric; extraocular muscles intact;  Nose without nasal septal hypertrophy Mouth/Parynx benign; Mallinpatti scale Neck: No JVD, no carotid bruits; normal carotid upstroke Lungs: clear to ausculatation and percussion; no wheezing or rales Chest wall: without tenderness to palpitation Heart: PMI not displaced, RRR, s1 s2 normal, 1/6 systolic murmur, no diastolic murmur, no rubs, gallops, thrills, or heaves Abdomen: soft, nontender; no hepatosplenomehaly, BS+; abdominal aorta nontender and not dilated by palpation. Back: no CVA  tenderness Pulses 2+ Musculoskeletal: full range of motion, normal strength, no joint deformities Extremities: no clubbing cyanosis or edema, Homan's sign negative  Neurologic: grossly nonfocal; Cranial nerves grossly wnl Psychologic: Normal mood and affect   Studies/Labs Reviewed:   EKG:  EKG is ordered today.  ECG (independently read by me):  NSR at 61; Early transition, mild RV conduction delay  Recent Labs: BMP Latest Ref Rng & Units 05/10/2016 06/02/2011 01/21/2010  Glucose 65 - 99 mg/dL 105(H) 116(H) 103(H)  BUN 6 - 20 mg/dL 15 18 25(H)  Creatinine 0.61 - 1.24 mg/dL 0.89 1.15 1.1  Sodium 135 - 145 mmol/L 140 136 138  Potassium 3.5 - 5.1 mmol/L 4.2 4.4 4.1  Chloride 101 - 111 mmol/L 103 100 101  CO2 22 - 32 mmol/L  30 25 -  Calcium 8.9 - 10.3 mg/dL 8.7(L) 7.6(L) -     Hepatic Function Latest Ref Rng & Units 06/02/2011  Total Protein 6.0 - 8.3 g/dL 5.8(L)  Albumin 3.5 - 5.2 g/dL 3.0(L)  AST 0 - 37 U/L 16  ALT 0 - 53 U/L 14  Alk Phosphatase 39 - 117 U/L 67  Total Bilirubin 0.3 - 1.2 mg/dL 0.3    CBC Latest Ref Rng & Units 05/10/2016 06/02/2011 01/21/2010  WBC 4.0 - 10.5 K/uL 7.1 10.6(H) 7.5  Hemoglobin 13.0 - 17.0 g/dL 12.8(L) 10.3(L) 12.3(L)  Hematocrit 39.0 - 52.0 % 39.4 32.1(L) 38.4(L)  Platelets 150 - 400 K/uL 184 200 217   Lab Results  Component Value Date   MCV 81.2 05/10/2016   MCV 76.8 (L) 06/02/2011   MCV 78.4 01/21/2010   Lab Results  Component Value Date   TSH 3.404 01/21/2010   Lab Results  Component Value Date   HGBA1C (H) 01/21/2010    6.3 (NOTE)                                                                       According to the ADA Clinical Practice Recommendations for 2011, when HbA1c is used as a screening test:   >=6.5%   Diagnostic of Diabetes Mellitus           (if abnormal result  is confirmed)  5.7-6.4%   Increased risk of developing Diabetes Mellitus  References:Diagnosis and Classification of Diabetes Mellitus,Diabetes CLEX,5170,01(VCBSW  1):S62-S69 and Standards of Medical Care in         Diabetes - 2011,Diabetes Care,2011,34  (Suppl 1):S11-S61.     BNP No results found for: BNP  ProBNP    Component Value Date/Time   PROBNP <30.0 01/21/2010 0806     Lipid Panel     Component Value Date/Time   CHOL  01/21/2010 0646    173        ATP III CLASSIFICATION:  <200     mg/dL   Desirable  200-239  mg/dL   Borderline High  >=240    mg/dL   High          TRIG 97 01/21/2010 0646   HDL 51 01/21/2010 0646   CHOLHDL 3.4 01/21/2010 0646   VLDL 19 01/21/2010 0646   LDLCALC (H) 01/21/2010 0646    103        Total Cholesterol/HDL:CHD Risk Coronary Heart Disease Risk Table                     Men   Women  1/2 Average Risk   3.4   3.3  Average Risk       5.0   4.4  2 X Average Risk   9.6   7.1  3 X Average Risk  23.4   11.0        Use the calculated Patient Ratio above and the CHD Risk Table to determine the patient's CHD Risk.        ATP III CLASSIFICATION (LDL):  <100     mg/dL   Optimal  100-129  mg/dL   Near or Above  Optimal  130-159  mg/dL   Borderline  160-189  mg/dL   High  >190     mg/dL   Very High     RADIOLOGY: No results found.   Additional studies/ records that were reviewed today include:   10/09/2020 FINDINGS: CORONARY CALCIUM SCORES:   Left Main: 58.9   LAD: 17.4   LCx: 56.4   RCA: 229   Total Agatston Score: 517   MESA database percentile: 56   AORTA MEASUREMENTS:   Ascending Aorta: 34 mm   Descending Aorta: 29 mm   OTHER FINDINGS:   Cardiovascular: Signs of aortic atherosclerosis. Heart size normal without substantial pericardial effusion. Central pulmonary vasculature normal caliber. Limited assessment of cardiovascular structures given lack of intravenous contrast.   Mediastinum/Nodes: Small hiatal hernia. Visualized mediastinal structures are otherwise unremarkable, no adenopathy within the visible portions of the chest.   Lungs/Pleura:  Elevated LEFT hemidiaphragm with juxta diaphragmatic atelectasis. No effusion. No consolidation in visualized portions of the chest aside from mild atelectatic changes.   Upper Abdomen: Limited imaging of upper abdominal contents without acute findings, moderate to marked elevation of LEFT hemidiaphragm.   Musculoskeletal: Spinal degenerative changes without acute or destructive bone process   IMPRESSION: Total Agatston score of 517. This places the patient in the 56th percentile for age, gender and race/ethnicity.   Elevated LEFT hemidiaphragm with juxta diaphragmatic atelectasis. Finding similar to scout images from CT assessments from 2013.   Small hiatal hernia.     Electronically Signed   By: Zetta Bills M.D.   On: 10/10/2020 15:36   ASSESSMENT:    1. 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 OSA on CPAP   3. Hyperlipidemia with target LDL less than 70   4. Systolic murmur   5. Medication management     PLAN:  Mr. 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  He has a history of hyperlipidemia and remotely had been on statin therapy.  Recent laboratory in September 2022 showed marked elevation of LDL cholesterol at 186 which raises concern for possible familial etiology.  He was started on low-dose rosuvastatin at 5 mg.  Car coronary calcium score was elevated at 517 representing 18 percentile for age, gender and race.  He is without angina and admits to significant activity and continues to go rockclimbing several times per month at [employer].  In October, he had an episode of lightheadedness with possible brief syncope which may have been associated with dehydration.  His ECG is stable with sinus rhythm and early transition.  He denies any chest pain, exertional dyspnea, or palpitations.  He has a history of GERD for which he takes famotidine.  He takes tadalafil for his prostate  hypertrophy.  He uses CPAP with 100% compliance and has been on therapy for 2 years.  His wife is a patient of mine and she has requested for me to also follow his obstructive sleep apnea if possible.  He has a follow-up appointment with Dr. [name] who will be rechecking laboratory.  I reviewed data with him regarding subclinical atherosclerosis with aggressive treatment contributing to plaque stability and potential plaque regression.  I have recommended aggressive lipid therapy with target LDL less than 70, and I suspect rosuvastatin may need to be titrated to 20 mg but I will await his subsequent laboratory.  I also suggested that an LP(a) be obtained at the time of his next  lipid study.  I have recommended that he undergo a 2D echo Doppler study to assess systolic and diastolic function, wall thickness, and valvular architecture.  I will see him in 3 to 4 months for follow-up evaluation or sooner as needed.   Medication Adjustments/Labs and Tests Ordered: Current medicines are reviewed at length with the patient today.  Concerns regarding medicines are outlined above.  Medication changes, Labs and Tests ordered today are listed in the Patient Instructions below. Patient Instructions  Medication Instructions:  The current medical regimen is effective;  continue present plan and medications as directed. Please refer to the Current Medication list given to you today.   *If you need a refill on your cardiac medications before your next appointment, please call your pharmacy*  Lab Work:    FASTING LIPID AND Lpa AFTER TISOVEC APPT  Testing/Procedures:  Echocardiogram - Your physician has requested that you have an echocardiogram. Echocardiography is a painless test that uses sound waves to create images of your heart. It provides your doctor with information about the size and shape of your heart and how well your hearts chambers and valves are working. This procedure takes approximately one hour.  There are no restrictions for this procedure. This will be performed at either our [facility] location - [address], [place] location [employer] 2nd floor.  Follow-Up: Your next appointment:  3-4 month(s) In Person with [name], MD     At [facility], you and your health needs are our priority.  As part of our continuing mission to provide you with exceptional heart care, we have created designated Provider Care Teams.  These Care Teams include your primary Cardiologist (physician) and Advanced Practice Providers (APPs -  Physician Assistants and Nurse Practitioners) who all work together to provide you with the care you need, when you need it.     Signed, [name], MD  03/12/2021 12:03 PM    [place] [address], [place], [place], [place]  [address] Phone: [phone]

## 2021-03-12 ENCOUNTER — Encounter: Payer: Self-pay | Admitting: Cardiovascular Disease

## 2021-03-14 LAB — LIPID PANEL
Chol/HDL Ratio: 3.2 ratio (ref 0.0–5.0)
Cholesterol, Total: 167 mg/dL (ref 100–199)
HDL: 53 mg/dL (ref >39)
LDL Chol Calc (NIH): 99 mg/dL (ref 0–99)
Triglycerides: 78 mg/dL (ref 0–149)
VLDL Cholesterol Cal: 15 mg/dL (ref 5–40)

## 2021-03-14 LAB — LIPOPROTEIN A (LPA): Lipoprotein (a): 41.8 nmol/L (ref <75.0)

## 2021-03-24 ENCOUNTER — Other Ambulatory Visit: Payer: Self-pay

## 2021-03-24 ENCOUNTER — Ambulatory Visit (INDEPENDENT_AMBULATORY_CARE_PROVIDER_SITE_OTHER): Payer: Medicare Other

## 2021-03-24 DIAGNOSIS — Z79899 Other long term (current) drug therapy: Secondary | ICD-10-CM | POA: Diagnosis not present

## 2021-03-24 DIAGNOSIS — R011 Cardiac murmur, unspecified: Secondary | ICD-10-CM

## 2021-03-24 LAB — ECHOCARDIOGRAM COMPLETE
Area-P 1/2: 1.89 cm2
Calc EF: 70.6 %
S' Lateral: 2.78 cm
Single Plane A2C EF: 69.2 %
Single Plane A4C EF: 74.1 %

## 2021-03-26 ENCOUNTER — Encounter: Payer: Self-pay | Admitting: Cardiovascular Disease

## 2021-03-26 ENCOUNTER — Other Ambulatory Visit: Payer: Self-pay

## 2021-03-27 NOTE — Progress Notes (Signed)
See mychart messages- ?Will reach out to [facility] in regards to what dose of Rosuvastatin he should be taking.  ? ? ? ?

## 2021-07-21 ENCOUNTER — Ambulatory Visit (INDEPENDENT_AMBULATORY_CARE_PROVIDER_SITE_OTHER): Payer: Medicare Other | Admitting: Cardiovascular Disease

## 2021-07-21 ENCOUNTER — Encounter: Payer: Self-pay | Admitting: Cardiovascular Disease

## 2021-07-21 VITALS — BP 140/66 | HR 66 | Ht 69.5 in | Wt 162.8 lb

## 2021-07-21 DIAGNOSIS — R931 Abnormal findings on diagnostic imaging of heart and coronary circulation: Secondary | ICD-10-CM

## 2021-07-21 DIAGNOSIS — I358 Other nonrheumatic aortic valve disorders: Secondary | ICD-10-CM

## 2021-07-21 DIAGNOSIS — E785 Hyperlipidemia, unspecified: Secondary | ICD-10-CM | POA: Diagnosis not present

## 2021-07-21 DIAGNOSIS — Z9989 Dependence on other enabling machines and devices: Secondary | ICD-10-CM

## 2021-07-21 DIAGNOSIS — G4733 Obstructive sleep apnea (adult) (pediatric): Secondary | ICD-10-CM | POA: Diagnosis not present

## 2021-07-21 NOTE — Patient Instructions (Signed)

## 2021-07-21 NOTE — Progress Notes (Signed)
Cardiology Office Note    Date:  07/26/2021   ID:  Patrick, Carroll 22-Sep-1940, MRN [id number]  PCP:  [name], MD  Cardiologist:  [name], MD   4 month cardiology evaluation  History of Present Illness:  Patrick Carroll is a 81 y.o. male who is followed by Dr. [name] at [facility].  I saw him for my initial cardiology evaluation on March 11, 2021.   [name] admits to being very active and in October 2022 apparently had an episode of weakness and possible syncope on heavy exertion while walking and climbing.  On one occasion 20 years previously he had a similar episode and was felt the hydration or possible vagal response.  Remotely, he had been on statin therapy.  He denies any episodes of chest pain or shortness of breath.  Laboratory on September 10, 2020 showed significant cholesterol elevation at 248 with HDL 46, LDL 186, triglycerides 82.  He was started on rosuvastatin 5 mg.  He was referred for a coronary artery calcium score which revealed an agate stent score of 517 representing 56 percentile based on age race and gender.  Calcification in the left main was 58.9, LAD 17.4, circumflex 56.4, and RCA 229.  He also was noted to have elevated left hemidiaphragm with juxta diaphragmatic atelectasis and this finding was similar to prior CT assessment from 2013.  He had a small hiatal hernia.  Repeat laboratory on January 23 showed improvement of LDL cholesterol to 105 and since that time his rosuvastatin dose was further titrated to 10 mg.  He continues to be active and rock climbs several times per month at [employer].  He has a history of obstructive sleep apnea and has been on CPAP therapy for 2 years with his DME company [employer].  He has been followed by a physician in [facility].  CPAP compliance is 100%.  During that evaluation, I had an extensive discussion with him regarding subclinical atherosis with aggressive treatment contributing to plaque  stability and potential plaque regression.  I recommended aggressive lipid-lowering therapy with target LDL less than 70 and discussed potential need for further titration of his rosuvastatin.  I suggested a LP(a) be obtained at the time of his next lipid studies I recommended he undergo 2D echo Doppler study to evaluate systolic and diastolic function as well as valvular architecture.  Since I saw him, he has continued to be active.  His blood pressure at home has been stable.  LP(a) was normal at 41.8.  His echo Doppler study from March 24, 2021 showed normal LV function with EF 55 to 60%.  He had normal wall motion and diastolic parameters.  There was mild aortic valve sclerosis without stenosis.  With his elevated calcium score I recommended that he increase rosuvastatin to 20 mg.  He denies any chest pain or recurrent episodes of presyncope.  He is tolerating rosuvastatin 20 mg.  He takes Pepcid for GERD.  He is scheduled to see Dr. [name] in follow-up in September and repeat laboratory will be obtained.  He presents for reevaluation.   Medical history is notable for BPH, GERD, OSA he had COVID in 2022 treated with Paxlovid.  Past surgical history is notable for cataract surgery  Current Medications: Outpatient Medications Prior to Visit  Medication Sig Dispense Refill   b complex vitamins capsule Take 1 capsule by mouth daily.     Cyanocobalamin (B-12) 500 MCG TABS take 2 a  day Oral     famotidine (PEPCID) 10 MG tablet Take 10 mg by mouth 2 (two) times daily.     Multiple Vitamin (MULITIVITAMIN WITH MINERALS) TABS Take 1 tablet by mouth daily.     psyllium (REGULOID) 0.52 g capsule Take 0.52 g by mouth daily.     rosuvastatin (CRESTOR) 20 MG tablet Take 20 mg by mouth daily.     Tadalafil (CIALIS) 2.5 MG TABS Take by mouth.     B Complex-C (B-COMPLEX WITH VITAMIN C) tablet Take 1 tablet by mouth every other day. (Patient not taking: Reported on 07/21/2021)     No facility-administered  medications prior to visit.     Allergies:   Penicillins   Social History   Socioeconomic History   Marital status: Married    Spouse name: Not on file   Number of children: Not on file   Years of education: Not on file   Highest education level: Not on file  Occupational History   Not on file  Tobacco Use   Smoking status: Former    Types: Cigarettes    Quit date: 87    Years since quitting: 44.5   Smokeless tobacco: Never  Vaping Use   Vaping Use: Never used  Substance and Sexual Activity   Alcohol use: Never   Drug use: Never   Sexual activity: Not on file  Other Topics Concern   Not on file  Social History Narrative   Not on file   Social Determinants of Health   Financial Resource Strain: Not on file  Food Insecurity: Not on file  Transportation Needs: Not on file  Physical Activity: Not on file  Stress: Not on file  Social Connections: Not on file    He was born in [place], [place].  He had been a paramedic for [employer].  He had remotely smoked and quit in 1975.  He is married for 60 years and has 3 children, and 7 grandchildren.  Family History: Family history is notable for both parents being deceased.  His mother died at age 25 and had previously undergone CABG and had heart failure.  Father died at age 63 with Alzheimer's disease.  A brother died with: CVA.  He has 3 children.  ROS General: Negative; No fevers, chills, or night sweats;  HEENT: Negative; No changes in vision or hearing, sinus congestion, difficulty swallowing Pulmonary: Negative; No cough, wheezing, shortness of breath, hemoptysis Cardiovascular: See HPI GI: Negative; No nausea, vomiting, diarrhea, or abdominal pain GU: Negative; No dysuria, hematuria, or difficulty voiding Musculoskeletal: Negative; no myalgias, joint pain, or weakness Hematologic/Oncology: Negative; no easy bruising, bleeding Endocrine: Negative; no heat/cold intolerance; no diabetes Neuro: Negative; no  changes in balance, headaches Skin: Negative; No rashes or skin lesions Psychiatric: Negative; No behavioral problems, depression Sleep: Positive for OSA on CPAP  Other comprehensive 14 point system review is negative.   PHYSICAL EXAM:   VS:  BP 140/66   Pulse 66   Ht 5' 9.5" (1.765 m)   Wt 162 lb 12.8 oz (73.8 kg)   SpO2 95%   BMI 23.70 kg/m     Repeat blood pressure by me was 122/60 supine and 124/58 standing  Wt Readings from Last 3 Encounters:  07/21/21 162 lb 12.8 oz (73.8 kg)  03/11/21 166 lb 3.2 oz (75.4 kg)     General: Alert, oriented, no distress.  Skin: normal turgor, no rashes, warm and dry HEENT: Normocephalic, atraumatic. Pupils equal round and reactive  to light; sclera anicteric; extraocular muscles intact; Nose without nasal septal hypertrophy Mouth/Parynx benign; Mallinpatti scale 2 Neck: No JVD, no carotid bruits; normal carotid upstroke Lungs: clear to ausculatation and percussion; no wheezing or rales Chest wall: without tenderness to palpitation Heart: PMI not displaced, RRR, s1 s2 normal, 1/6 systolic murmur, no diastolic murmur, no rubs, gallops, thrills, or heaves Abdomen: soft, nontender; no hepatosplenomehaly, BS+; abdominal aorta nontender and not dilated by palpation. Back: no CVA tenderness Pulses 2+ Musculoskeletal: full range of motion, normal strength, no joint deformities Extremities: no clubbing cyanosis or edema, Homan's sign negative  Neurologic: grossly nonfocal; Cranial nerves grossly wnl Psychologic: Normal mood and affect     Studies/Labs Reviewed:   July 21, 2021 ECG (independently read by me): NSR at 68, IRBBB, no ectopy  March 11, 2021 ECG (independently read by me):  NSR at 61; Early transition, mild RV conduction delay  Recent Labs:    Latest Ref Rng & Units 05/10/2016    8:32 AM 06/02/2011    4:31 PM 01/21/2010    4:22 AM  BMP  Glucose 65 - 99 mg/dL 105  116  103   BUN 6 - 20 mg/dL $Remove'15  18  25   'zWgVUnA$ Creatinine 0.61 - 1.24  mg/dL 0.89  1.15  1.1   Sodium 135 - 145 mmol/L 140  136  138   Potassium 3.5 - 5.1 mmol/L 4.2  4.4  4.1   Chloride 101 - 111 mmol/L 103  100  101   CO2 22 - 32 mmol/L 30  25    Calcium 8.9 - 10.3 mg/dL 8.7  7.6          Latest Ref Rng & Units 06/02/2011    4:31 PM  Hepatic Function  Total Protein 6.0 - 8.3 g/dL 5.8   Albumin 3.5 - 5.2 g/dL 3.0   AST 0 - 37 U/L 16   ALT 0 - 53 U/L 14   Alk Phosphatase 39 - 117 U/L 67   Total Bilirubin 0.3 - 1.2 mg/dL 0.3        Latest Ref Rng & Units 05/10/2016    8:32 AM 06/02/2011    4:31 PM 01/21/2010   10:06 PM  CBC  WBC 4.0 - 10.5 K/uL 7.1  10.6  7.5   Hemoglobin 13.0 - 17.0 g/dL 12.8  10.3  12.3   Hematocrit 39.0 - 52.0 % 39.4  32.1  38.4   Platelets 150 - 400 K/uL 184  200  217    Lab Results  Component Value Date   MCV 81.2 05/10/2016   MCV 76.8 (L) 06/02/2011   MCV 78.4 01/21/2010   Lab Results  Component Value Date   TSH 3.404 01/21/2010   Lab Results  Component Value Date   HGBA1C (H) 01/21/2010    6.3 (NOTE)                                                                       According to the ADA Clinical Practice Recommendations for 2011, when HbA1c is used as a screening test:   >=6.5%   Diagnostic of Diabetes Mellitus           (if abnormal result  is confirmed)  5.7-6.4%  Increased risk of developing Diabetes Mellitus  References:Diagnosis and Classification of Diabetes Mellitus,Diabetes ATFT,7322,02(RKYHC 1):S62-S69 and Standards of Medical Care in         Diabetes - 2011,Diabetes WCBJ,6283,15  (Suppl 1):S11-S61.     BNP No results found for: "BNP"  ProBNP    Component Value Date/Time   PROBNP <30.0 01/21/2010 0806     Lipid Panel     Component Value Date/Time   CHOL 167 03/13/2021 0836   TRIG 78 03/13/2021 0836   HDL 53 03/13/2021 0836   CHOLHDL 3.2 03/13/2021 0836   CHOLHDL 3.4 01/21/2010 0646   VLDL 19 01/21/2010 0646   LDLCALC 99 03/13/2021 0836   LABVLDL 15 03/13/2021 0836      RADIOLOGY: No results found.   Additional studies/ records that were reviewed today include:   10/09/2020 FINDINGS: CORONARY CALCIUM SCORES:   Left Main: 58.9   LAD: 17.4   LCx: 56.4   RCA: 229   Total Agatston Score: 517   MESA database percentile: 56   AORTA MEASUREMENTS:   Ascending Aorta: 34 mm   Descending Aorta: 29 mm   OTHER FINDINGS:   Cardiovascular: Signs of aortic atherosclerosis. Heart size normal without substantial pericardial effusion. Central pulmonary vasculature normal caliber. Limited assessment of cardiovascular structures given lack of intravenous contrast.   Mediastinum/Nodes: Small hiatal hernia. Visualized mediastinal structures are otherwise unremarkable, no adenopathy within the visible portions of the chest.   Lungs/Pleura: Elevated LEFT hemidiaphragm with juxta diaphragmatic atelectasis. No effusion. No consolidation in visualized portions of the chest aside from mild atelectatic changes.   Upper Abdomen: Limited imaging of upper abdominal contents without acute findings, moderate to marked elevation of LEFT hemidiaphragm.   Musculoskeletal: Spinal degenerative changes without acute or destructive bone process   IMPRESSION: Total Agatston score of 517. This places the patient in the 56th percentile for age, gender and race/ethnicity.   Elevated LEFT hemidiaphragm with juxta diaphragmatic atelectasis. Finding similar to scout images from CT assessments from 2013.   Small hiatal hernia.     Electronically Signed   By: Zetta Bills M.D.   On: 10/10/2020 15:36   ECHO: 03/24/2021  1. Left ventricular ejection fraction, by estimation, is 55 to 60%. The  left ventricle has normal function. The left ventricle has no regional  wall motion abnormalities. Left ventricular diastolic parameters were  normal.   2. Right ventricular systolic function is normal. The right ventricular  size is normal.   3. The mitral valve is  normal in structure. No evidence of mitral valve  regurgitation.   4. The aortic valve is tricuspid. There is mild calcification of the  aortic valve. There is mild thickening of the aortic valve. Aortic valve  regurgitation is not visualized. Aortic valve sclerosis/calcification is  present, without any evidence of  aortic stenosis.   5. The inferior vena cava is normal in size with greater than 50%  respiratory variability, suggesting right atrial pressure of 3 mmHg.    ASSESSMENT:    1. 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 OSA on CPAP   3. Hyperlipidemia with target LDL less than 70   4. Aortic valve sclerosis      PLAN:  Mr. Treasure Ingrum is a young appearing 81 year old active gentleman who is a retired Audiological scientist.  He has a history of hyperlipidemia and remotely had been on statin therapy.  Recent laboratory in September 2022 showed marked elevation of LDL cholesterol at 186 which raises  concern for possible familial etiology.  He was started on low-dose rosuvastatin at 5 mg.  Coronary calcium score was elevated at 517 representing 47 percentile for age, gender and race.  He is without angina and admits to significant activity and continues to go rockclimbing several times per month at [employer].  In October, he had an episode of lightheadedness with possible brief syncope which may have been associated with dehydration.  His ECG is stable with sinus rhythm and early transition.  He denies any chest pain, exertional dyspnea, or palpitations.  He has a history of GERD for which he takes famotidine.  He takes tadalafil for his prostate hypertrophy.  He uses CPAP with 100% compliance and has been on therapy for 2 years.  His wife is a patient of mine and she has requested for me to also follow his obstructive sleep apnea if possible.  At my initial evaluation I recommended he undergo an LP(a) evaluation.  This revealed a normal level at 41.8.  He underwent a 2D  echo Doppler on March 24, 2021 which showed normal LV function with EF 55 to 60% without wall motion abnormalities.  He had normal diastolic parameters.  There was mild aortic valve calcification with aortic sclerosis without stenosis.  He is now on an increased regimen of rosuvastatin 20 mg and is tolerating this well without myalgias.  His GERD is controlled with famotidine.  He denies any chest pain symptomatology.  On exam today he is not orthostatic and is euvolemic.  He will be seeing Dr. [name] in several months and repeat laboratory will be obtained.  As long as he is stable I will see him in 1 year for reevaluation or sooner as needed.   Medication Adjustments/Labs and Tests Ordered: Current medicines are reviewed at length with the patient today.  Concerns regarding medicines are outlined above.  Medication changes, Labs and Tests ordered today are listed in the Patient Instructions below. Patient Instructions  Medication Instructions:  Your physician recommends that you continue on your current medications as directed. Please refer to the Current Medication list given to you today.  *If you need a refill on your cardiac medications before your next appointment, please call your pharmacy*   Follow-Up: At [facility], you and your health needs are our priority.  As part of our continuing mission to provide you with exceptional heart care, we have created designated Provider Care Teams.  These Care Teams include your primary Cardiologist (physician) and Advanced Practice Providers (APPs -  Physician Assistants and Nurse Practitioners) who all work together to provide you with the care you need, when you need it.  We recommend signing up for the patient portal called "MyChart".  Sign up information is provided on this After Visit Summary.  MyChart is used to connect with patients for Virtual Visits (Telemedicine).  Patients are able to view lab/test results, encounter notes, upcoming  appointments, etc.  Non-urgent messages can be sent to your provider as well.   To learn more about what you can do with MyChart, go to [email or web address].    Your next appointment:   12 month(s)  The format for your next appointment:   In Person  Provider:   [name], MD   Signed, [name], MD  07/26/2021 9:10 AM    [facility] [address] [place], [place], [place]  [address] Phone: [phone]

## 2021-07-26 ENCOUNTER — Encounter: Payer: Self-pay | Admitting: Cardiovascular Disease

## 2021-12-21 ENCOUNTER — Ambulatory Visit: Payer: Medicare Other | Admitting: Dermatology

## 2022-03-18 ENCOUNTER — Encounter: Payer: Self-pay | Admitting: Dermatology

## 2022-03-18 ENCOUNTER — Ambulatory Visit (INDEPENDENT_AMBULATORY_CARE_PROVIDER_SITE_OTHER): Payer: Medicare Other | Admitting: Dermatology

## 2022-03-18 ENCOUNTER — Ambulatory Visit: Payer: Medicare Other | Admitting: Dermatology

## 2022-03-18 DIAGNOSIS — L578 Other skin changes due to chronic exposure to nonionizing radiation: Secondary | ICD-10-CM

## 2022-03-18 NOTE — Progress Notes (Signed)
   New Patient Visit  Subjective  Patrick Carroll is a 82 y.o. male who presents for the following: Annual Exam (Last dermatology visit in 2019. Spot on left side neck. Itchy back and chest. Has tried Cereve with temporary relief. Heat and cold brings rash. ).  Accompanied by wife  Objective  Well appearing patient in no apparent distress; mood and affect are within normal limits.  A full examination was performed including scalp, head, eyes, ears, nose, lips, neck, chest, axillae, abdomen, back, buttocks, bilateral upper extremities, bilateral lower extremities, hands, feet, fingers, toes, fingernails, and toenails. All findings within normal limits unless otherwise noted below.  A dermatoscope was used during the exam.  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  - Staying in the shade or wearing long sleeves, sun glasses (UVA+UVB protection) and wide brim hats (4-inch brim around the entire circumference of the hat) are also recommended for sun protection.  - Call for new or changing lesions.  Lentigines - Scattered tan macules - Due to sun exposure - Benign-appearing, observe - Recommend daily broad spectrum sunscreen SPF 30+ to sun-exposed areas, reapply every 2 hours as needed. - Call for any changes  Seborrheic Keratoses - Stuck-on, waxy, tan-brown papules and/or plaques  - Benign-appearing - Discussed benign etiology and prognosis. - Observe - Call for any changes  Melanocytic Nevi - Tan-brown and/or pink-flesh-colored symmetric macules and papules - Benign appearing on exam today - Observation -  Call clinic for new or changing moles - Recommend daily use of broad spectrum spf 30+ sunscreen to sun-exposed areas.   Hemangiomas - Red papules - Discussed benign nature - Observe - Call for any changes  Skin cancer screening performed today. No follow-ups on file.  Documentation: I have reviewed the above documentation for accuracy and completeness, and I agree with the above  [place], DO

## 2022-06-07 IMAGING — CT CT CARDIAC CORONARY ARTERY CALCIUM SCORE
3 series · 14 of 20 positions shown, 16 images · non-contrast
Comparison: None

CLINICAL DATA: A 80-year-old male presents for evaluation of
coronary artery calcium.

EXAM:
CT CARDIAC CORONARY ARTERY CALCIUM SCORE
TECHNIQUE: Non-contrast imaging through the heart was performed using
prospective ECG gating. Image post processing was performed on an
independent workstation, allowing for quantitative analysis of the
heart and coronary arteries. Note that this exam targets the heart
and the chest was not imaged in its entirety.

[Series 2: calcium scoring 2.00 qr36 bestdiast 69% hrt calciu · axial · 0.43mm/px · z∈[+1552,+1642]mm · 4 of 75 slices shown]
[im 15/75  vessel]
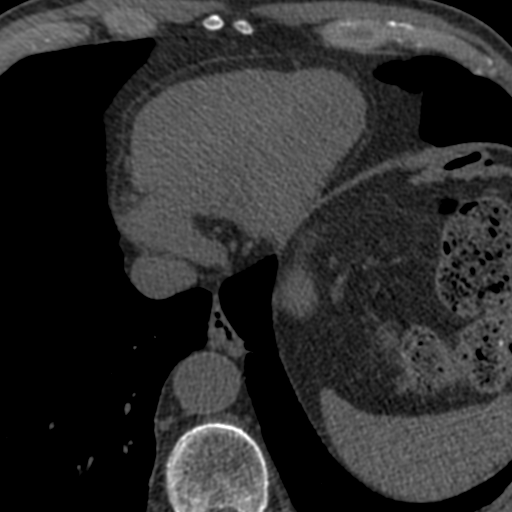
[im 30/75  vessel]
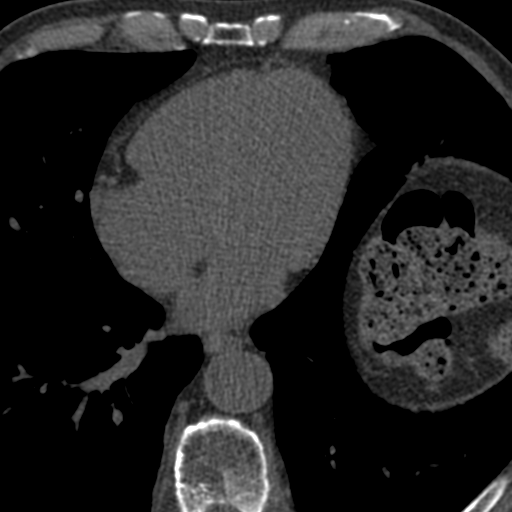
[im 45/75  vessel]
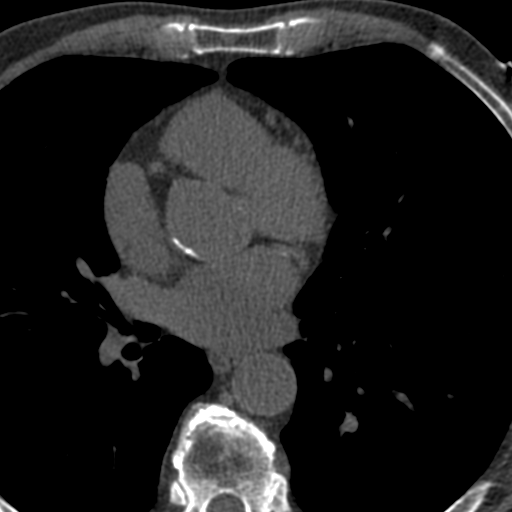
[im 60/75  vessel]
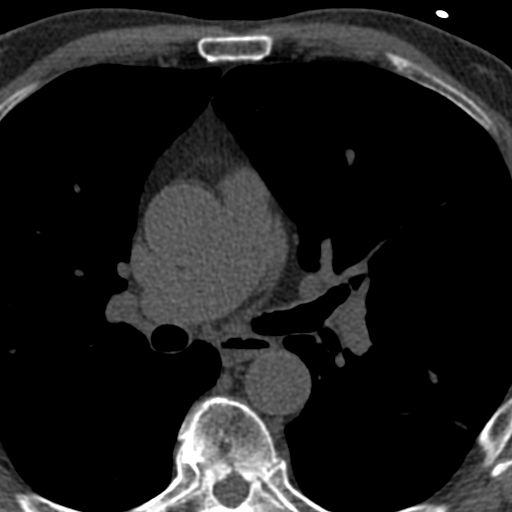

[Series 3: calcium scoring 2.00 br40 bestdiast 69% axial · axial · 0.56mm/px · z∈[+1548,+1646]mm · 5 of 75 slices shown, 7 images]
[im 13/75  vessel]
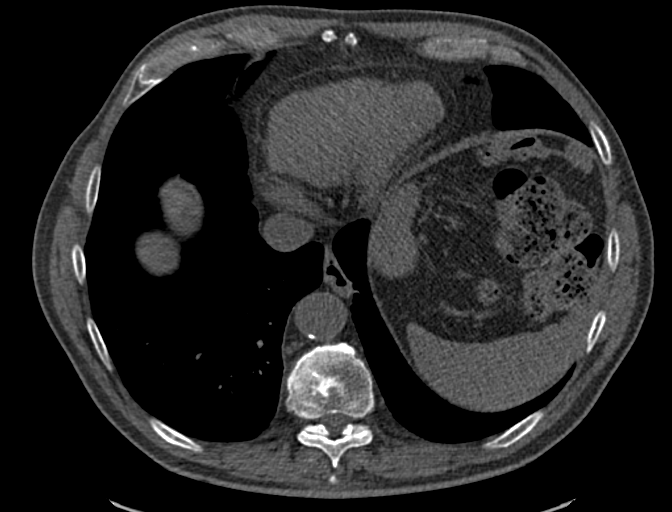
[im 13/75  lung]
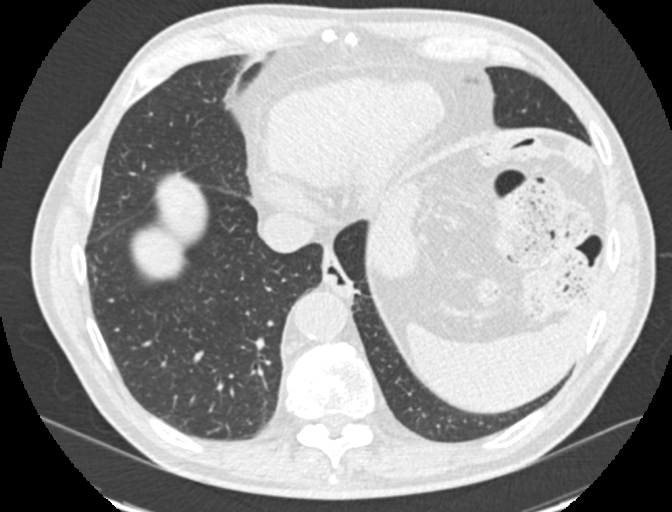
[im 25/75  vessel]
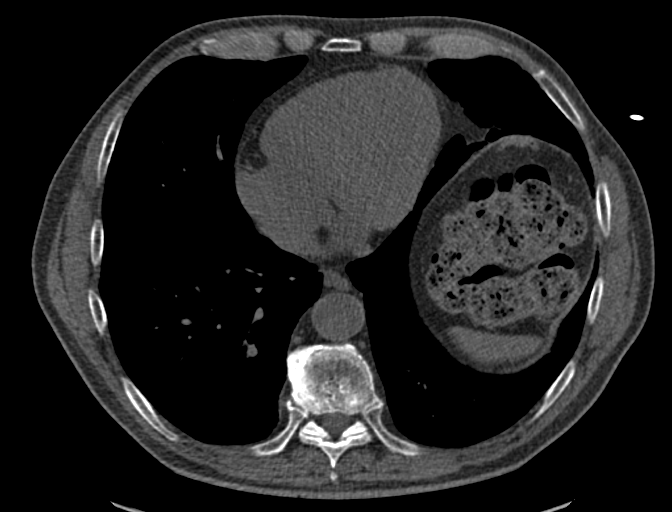
[im 38/75  vessel]
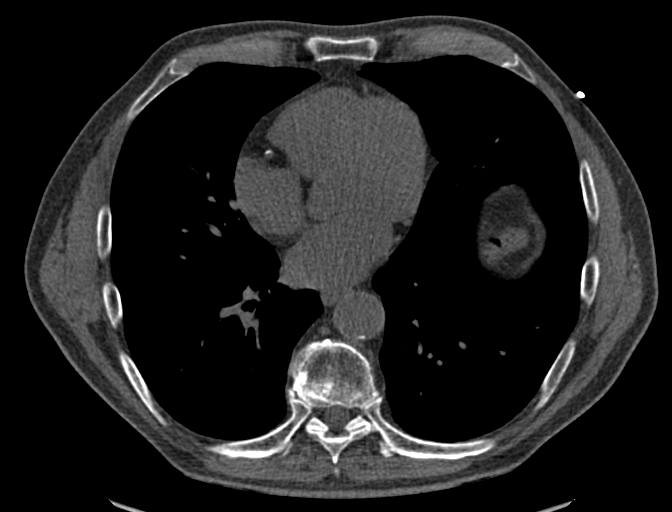
[im 50/75  vessel]
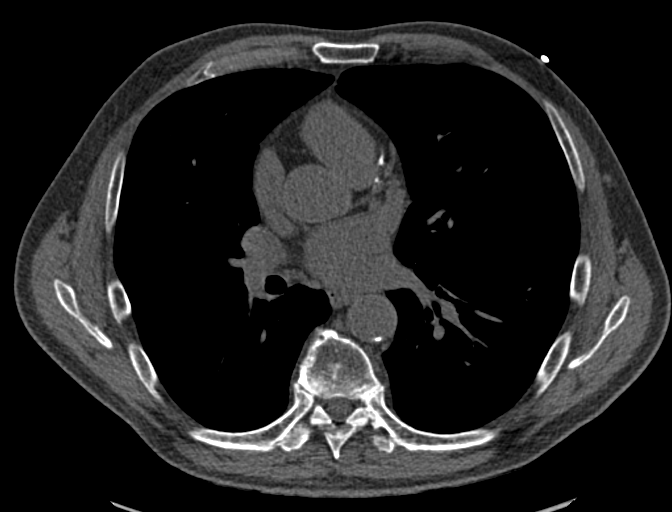
[im 62/75  vessel]
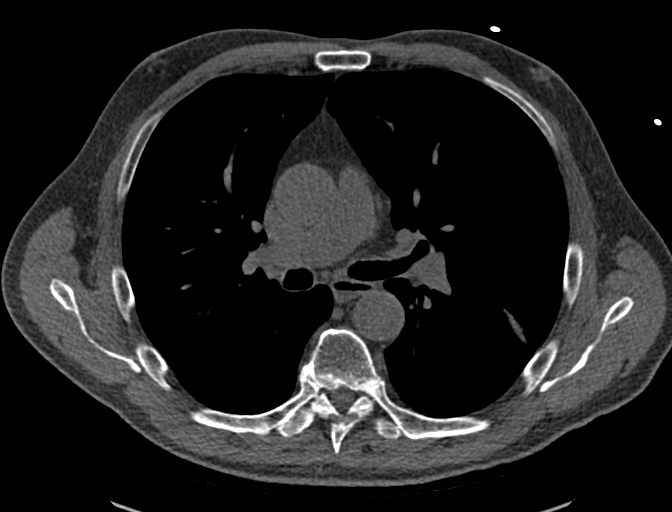
[im 62/75  lung]
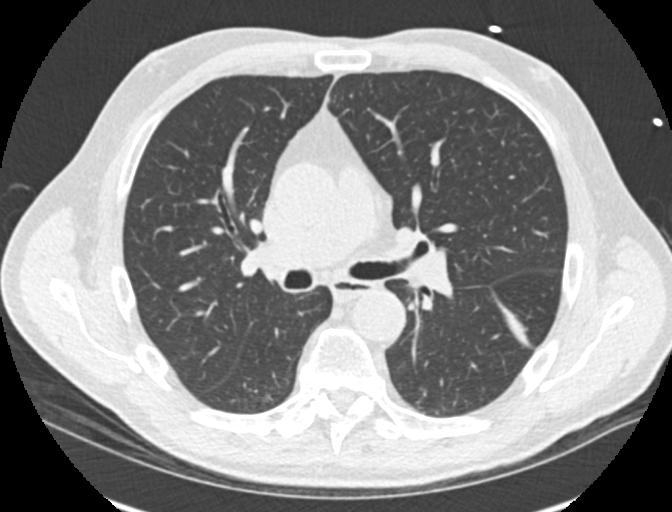

[Series 9: calcium scoring 2.00 br60 bestdiast 69% lungs · axial · 0.56mm/px · z∈[+1548,+1646]mm · 5 of 75 slices shown]
[im 13/75  vessel]
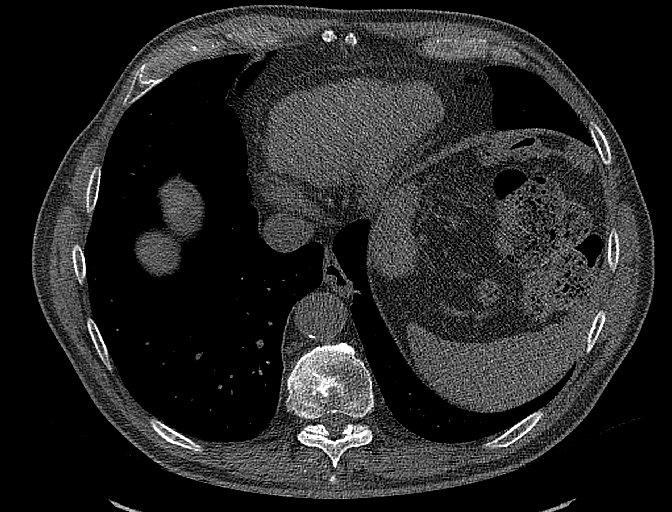
[im 25/75  vessel]
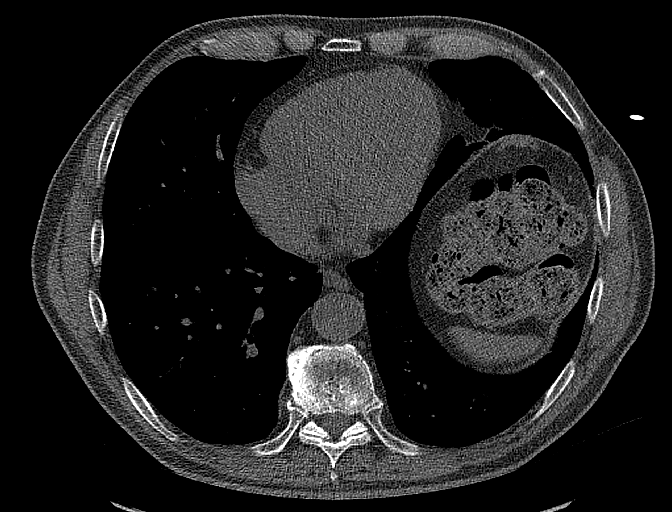
[im 38/75  vessel]
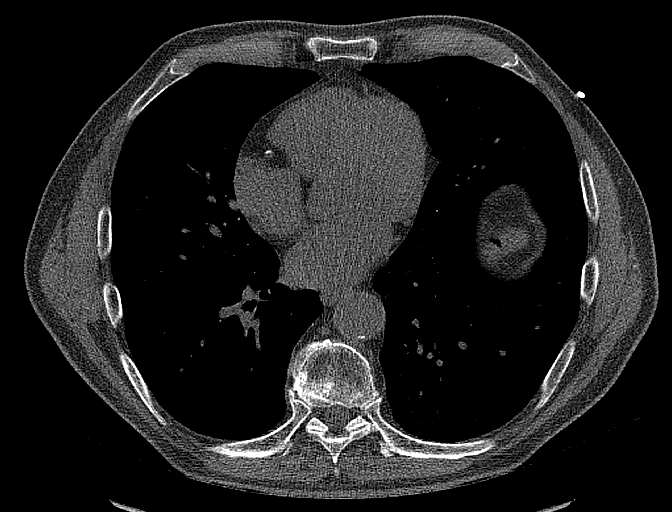
[im 50/75  vessel]
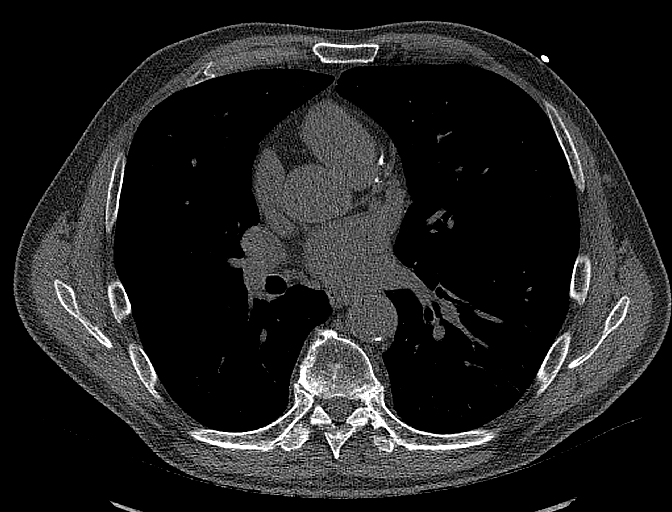
[im 62/75  vessel]
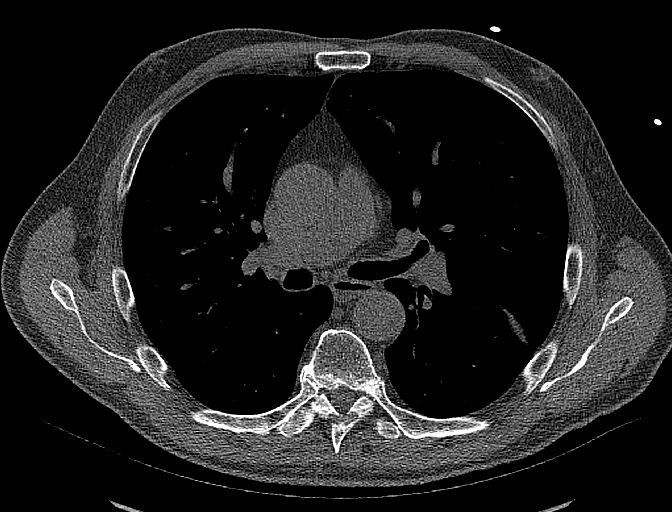

[14 of 20 positions shown; findings below may reference images not displayed]

FINDINGS: CORONARY CALCIUM SCORES:

Left Main:

LAD:

LCx:

RCA: 229

Total Agatston Score: 517

[HOSPITAL] percentile: 56

AORTA MEASUREMENTS:

Ascending Aorta: 34 mm

Descending Aorta: 29 mm

OTHER FINDINGS:

Cardiovascular: Signs of aortic atherosclerosis. Heart size normal
without substantial pericardial effusion. Central pulmonary
vasculature normal caliber. Limited assessment of cardiovascular
structures given lack of intravenous contrast.

Mediastinum/Nodes: Small hiatal hernia. Visualized mediastinal
structures are otherwise unremarkable, no adenopathy within the
visible portions of the chest.

Lungs/Pleura: Elevated LEFT hemidiaphragm with juxta diaphragmatic
atelectasis. No effusion. No consolidation in visualized portions of
the chest aside from mild atelectatic changes.

Upper Abdomen: Limited imaging of upper abdominal contents without
acute findings, moderate to marked elevation of LEFT hemidiaphragm.

Musculoskeletal: Spinal degenerative changes without acute or
destructive bone process
IMPRESSION: Total Agatston score of 517. This places the patient in the 56th
percentile for age, gender and race/ethnicity.

Elevated LEFT hemidiaphragm with juxta diaphragmatic atelectasis.
Finding similar to scout images from CT assessments from 4872.

Small hiatal hernia.

## 2022-07-21 NOTE — Progress Notes (Signed)
Cardiology Office Note:  .   Date:  07/23/2022  ID:  Patrick Carroll, DOB 09-20-1940, MRN [id number] PCP: Gaspar Garbe, MD  [facility] Providers Cardiologist:  Nicki Guadalajara, MD  History of Present Illness: .   Patrick Carroll is a 82 y.o. male with a past medical history of elevated coronary calcium score, OSA on CPAP, HLD, aortic valve sclerosis, hiatal hernia. Patient is followed by Dr. Tresa Endo and presents today for annual evaluation   Per chart review, patient first established care with Dr. Tresa Endo in 03/2021 for evaluation of elevated coronary calcium score. Labs in 09/2020 showed LDL elevated to 186, and he was started on crestor 5 mg daily. He underwent coronary calcium scoring that showed a score of 517 (56th percentile). Repeat labs in 01/2021 showed LDL had improved to 105, and his crestor was increased to 10 mg daily. He reported excellent compliance with his CPAP, and he was very active (rock climbed several times per month.) When evaluated by Dr. Tresa Endo, he had a systolic murmur on exam. Underwent echocardiogram 03/24/21 that showed EF 55-60%, no regional wall motion abnormalities, normal LV diastolic parameters, normal RV systolic function, mild aortic calcification without evidence of aortic stenosis.   Patient was last seen by Dr. Tresa Endo on 07/21/21. At that time, patient's crestor was increased to 20 mg daily.   On interview, patient reports that he has been doing very well from a cardiac perspective. He does yoga and thi-chi, and goes to a rock climbing gym twice per week. Denies chest pain, shortness of breath. No ankle edema, abdominal distention. No dizziness, syncope, near syncope. He takes his BP a few times per week and his BP is usually in the 110s-120s/60s-70s. He is complaint with CPAP. He has had the same CPAP machine for 7 years, and he would like to get a new machine as his old one is getting worn down.   ROS: No chest pain, palpitations, syncope, near syncope,  shortness of breath, ankle edema.   Studies Reviewed: .    Cardiac Studies & Procedures     STRESS TESTS  NM MYOCAR MULTI W/SPECT W 01/22/2010  Narrative Clinical Data:  Chest pain in 82 year old male  MYOCARDIAL IMAGING WITH SPECT (REST AND PHARMACOLOGIC-STRESS) GATED LEFT VENTRICULAR WALL MOTION STUDY LEFT VENTRICULAR EJECTION FRACTION  Technique:  Resting myocardial SPECT imaging was initially performed after intravenous administration of radiopharmaceutical. Myocardial SPECT was subsequently performed after additional radiopharmaceutical injection during pharmacologic-stress supervised by the Cardiology staff.  Quantitative gated imaging was also performed to evaluate left ventricular wall motion, and estimate left ventricular ejection fraction.  Radiopharmaceutical:  Tc-57m Myoview at rest and during stress.  Comparison: none  Findings:  Technique: Study is adequate.  Perfusion:  There are no relative decreased counts on stress or rest to suggest reversible ischemia or infarction.  Decreased counts in the inferior wall on rest and stress are most likely related to the related to diaphragmatic attenuation.  Wall motion:  No focal wall motion abnormality.  Normal contractility.  Left ventricular ejection fraction:  Calculated left ventricular ejection fraction =  65%  IMPRESSION:  1.  No reversible ischemia or infarction. 2.  Normal wall motion. 3.  Left ventricular ejection fraction equal 65%  Provider: Arminda Resides, [place] Stedge   ECHOCARDIOGRAM  ECHOCARDIOGRAM COMPLETE 03/24/2021  Narrative ECHOCARDIOGRAM REPORT    Patient Name:   Patrick Carroll Date of Exam: 03/24/2021 Medical Rec #:  [id number]  Height:       69.5 in Accession #:    [id number]     Weight:       166.2 lb Date of Birth:  Oct 24, 1940      BSA:          1.919 m Patient Age:    80 years       BP:           125/60 mmHg Patient Gender: M              HR:            74 bpm. Exam Location:  Outpatient  Procedure: 2D Echo, Cardiac Doppler, Color Doppler and Strain Analysis  Indications:    R01.1 Murmur  History:        Patient has no prior history of Echocardiogram examinations. Signs/Symptoms:Syncope and Murmur; Risk Factors:Former Smoker and Dyslipidemia.  Sonographer:    Jeryl Columbia RDCS Referring Phys: [phone] THOMAS A KELLY   Sonographer Comments: Image acquisition challenging due to patient body habitus and best apicals found at end of study. IMPRESSIONS   1. Left ventricular ejection fraction, by estimation, is 55 to 60%. The left ventricle has normal function. The left ventricle has no regional wall motion abnormalities. Left ventricular diastolic parameters were normal. 2. Right ventricular systolic function is normal. The right ventricular size is normal. 3. The mitral valve is normal in structure. No evidence of mitral valve regurgitation. 4. The aortic valve is tricuspid. There is mild calcification of the aortic valve. There is mild thickening of the aortic valve. Aortic valve regurgitation is not visualized. Aortic valve sclerosis/calcification is present, without any evidence of aortic stenosis. 5. The inferior vena cava is normal in size with greater than 50% respiratory variability, suggesting right atrial pressure of 3 mmHg.  FINDINGS Left Ventricle: Left ventricular ejection fraction, by estimation, is 55 to 60%. The left ventricle has normal function. The left ventricle has no regional wall motion abnormalities. The global longitudinal strain is normal despite suboptimal segment tracking. The left ventricular internal cavity size was normal in size. There is no left ventricular hypertrophy. Left ventricular diastolic parameters were normal. Normal left ventricular filling pressure.  Right Ventricle: The right ventricular size is normal. No increase in right ventricular wall thickness. Right ventricular systolic function  is normal.  Left Atrium: Left atrial size was normal in size.  Right Atrium: Right atrial size was normal in size.  Pericardium: There is no evidence of pericardial effusion.  Mitral Valve: The mitral valve is normal in structure. No evidence of mitral valve regurgitation.  Tricuspid Valve: The tricuspid valve is not well visualized.  Aortic Valve: The aortic valve is tricuspid. There is mild calcification of the aortic valve. There is mild thickening of the aortic valve. Aortic valve regurgitation is not visualized. Aortic valve sclerosis/calcification is present, without any evidence of aortic stenosis.  Pulmonic Valve: The pulmonic valve was not well visualized.  Aorta: The aortic root was not well visualized.  Venous: The inferior vena cava was not well visualized. The inferior vena cava is normal in size with greater than 50% respiratory variability, suggesting right atrial pressure of 3 mmHg.  IAS/Shunts: The interatrial septum was not well visualized.   LEFT VENTRICLE PLAX 2D LVIDd:         3.23 cm     Diastology LVIDs:         2.78 cm     LV e' medial:    7.40 cm/s LV  PW:         1.06 cm     LV E/e' medial:  10.0 LV IVS:        0.92 cm     LV e' lateral:   8.38 cm/s LV E/e' lateral: 8.8  LV Volumes (MOD) LV vol d, MOD A2C: 72.3 ml LV vol d, MOD A4C: 78.7 ml LV vol s, MOD A2C: 22.3 ml LV vol s, MOD A4C: 20.4 ml LV SV MOD A2C:     50.0 ml LV SV MOD A4C:     78.7 ml LV SV MOD BP:      55.1 ml  LEFT ATRIUM             Index        RIGHT ATRIUM           Index LA Vol (A2C):   30.3 ml 15.79 ml/m  RA Area:     12.80 cm LA Vol (A4C):   24.4 ml 12.71 ml/m  RA Volume:   31.90 ml  16.62 ml/m LA Biplane Vol: 27.2 ml 14.17 ml/m AORTIC VALVE LVOT Vmax:   115.00 cm/s LVOT Vmean:  74.100 cm/s LVOT VTI:    0.231 m  MITRAL VALVE MV Area (PHT): 1.89 cm    SHUNTS MV Decel Time: 401 msec    Systemic VTI: 0.23 m MV E velocity: 73.90 cm/s MV A velocity: 74.90 cm/s MV E/A  ratio:  0.99  Mihai Croitoru MD Electronically signed by Thurmon Fair MD Signature Date/Time: 03/24/2021/3:26:48 PM    Final              Risk Assessment/Calculations:             Physical Exam:   VS:  BP 122/64   Pulse 67   Ht 5\' 9"  (1.753 m)   Wt 158 lb 6.4 oz (71.8 kg)   SpO2 97%   BMI 23.39 kg/m    Wt Readings from Last 3 Encounters:  07/23/22 158 lb 6.4 oz (71.8 kg)  07/21/21 162 lb 12.8 oz (73.8 kg)  03/11/21 166 lb 3.2 oz (75.4 kg)    GEN: Well nourished, well developed in no acute distress. Sitting comfortably in the chair  NECK: No JVD; No carotid bruits CARDIAC: RRR, no murmurs, rubs, gallops. Radial pulses 2+ bilaterally  RESPIRATORY:  Clear to auscultation without rales, wheezing or rhonchi. Normal work of breathing on room air  ABDOMEN: Soft, non-tender, non-distended EXTREMITIES:  No edema; No deformity   ASSESSMENT AND PLAN: .    Elevated Coronary Calcium Score  HLD  - Coronary calcium score was 517 in 02/2021 (56th percentile)  - Patient is active- does yoga, rock climbs twice a week. No chest pain or shortness of breath on exertion.  - Patient has been on crestor 20 mg daily- most recent lipid panel from 09/2021 showed LDL74, HDL 43, triglycerides 48, total cholesterol 127. Lipoprotein a 41.8  - Continue crestor 20 mg daily for risk reduction   OSA on CPAP  - Patient reports excellent compliance with CPAP - Echocardiogram in 03/2021 showed normal RV systolic function, normal ?RV size and wall thickness   - He has had his current CPAP machine for 7 years. His machine is getting old and has expected wear and tear. Needs a new CPAP  - Patient brought his compliance report and device information today to clinic - has been 100% compliant  Aortic Valve sclerosis  - Noted on echocardiogram from 03/2021. No aortic stenosis  noted - Continues to have murmur on exam. No chest pain, dyspnea, syncope/near syncope. No need to repeat echo at this time   Dispo:  Follow up in 1 year   Signed, Jonita Albee, PA-C

## 2022-07-23 ENCOUNTER — Other Ambulatory Visit: Payer: Self-pay

## 2022-07-23 ENCOUNTER — Encounter: Payer: Self-pay | Admitting: Cardiology

## 2022-07-23 ENCOUNTER — Ambulatory Visit: Payer: Medicare Other | Attending: Cardiology | Admitting: Cardiology

## 2022-07-23 VITALS — BP 122/64 | HR 67 | Ht 69.0 in | Wt 158.4 lb

## 2022-07-23 DIAGNOSIS — R931 Abnormal findings on diagnostic imaging of heart and coronary circulation: Secondary | ICD-10-CM

## 2022-07-23 DIAGNOSIS — G4733 Obstructive sleep apnea (adult) (pediatric): Secondary | ICD-10-CM | POA: Diagnosis not present

## 2022-07-23 DIAGNOSIS — I358 Other nonrheumatic aortic valve disorders: Secondary | ICD-10-CM | POA: Diagnosis not present

## 2022-07-23 DIAGNOSIS — E785 Hyperlipidemia, unspecified: Secondary | ICD-10-CM | POA: Diagnosis not present

## 2022-07-23 NOTE — Patient Instructions (Signed)
Medication Instructions:  Your physician recommends that you continue on your current medications as directed. Please refer to the Current Medication list given to you today.   *If you need a refill on your cardiac medications before your next appointment, please call your pharmacy*   Lab Work: None at this time   Testing/Procedures: None at this time   Follow-Up: At [facility], you and your health needs are our priority.  As part of our continuing mission to provide you with exceptional heart care, we have created designated Provider Care Teams.  These Care Teams include your primary Cardiologist (physician) and Advanced Practice Providers (APPs -  Physician Assistants and Nurse Practitioners) who all work together to provide you with the care you need, when you need it.  We recommend signing up for the patient portal called "MyChart".  Sign up information is provided on this After Visit Summary.  MyChart is used to connect with patients for Virtual Visits (Telemedicine).  Patients are able to view lab/test results, encounter notes, upcoming appointments, etc.  Non-urgent messages can be sent to your provider as well.   To learn more about what you can do with MyChart, go to [email or web address].    Your next appointment:   1 year(s)  Provider:   Nicki Guadalajara, MD  or Robet Leu, PA-C

## 2022-09-21 DIAGNOSIS — G4733 Obstructive sleep apnea (adult) (pediatric): Secondary | ICD-10-CM

## 2022-09-23 ENCOUNTER — Telehealth: Payer: Self-pay

## 2022-09-23 NOTE — Telephone Encounter (Signed)
Spoke with patient. Order for new machine and supplies sent to [facility] today 09/23/22. Settings on current CPAP changed to:  Set Therapy mode to AutoSet Set Min Pressure to 10.0 cmH2O Set Max Pressure to 15.0 cmH2O Set EPR to Fulltime Set EPR level to 3 cmH2O Set Response to Standard Set Ramp enable to Auto Set Start pressure to 4.0 cmH2O

## 2022-12-31 ENCOUNTER — Telehealth: Payer: Self-pay | Admitting: Cardiovascular Disease

## 2022-12-31 NOTE — Telephone Encounter (Signed)
[facility] is calling to get new orders for CPAP due to the one they have being expired. Also states that they need sleep study sent to them as well. Please advise.   Fax # [phone]

## 2023-01-04 NOTE — Telephone Encounter (Signed)
 Confirmed with Arvella Leer, [facility] representative, that original sleep study performed in 2017 will need to be on file to proceed. Sleep study interpretation is available in CareEverywhere, but scanned report is not.  Spoke with patient. He is agreeable to filling out a records release form to obtain sleep study report. Advised I will send him the form via MyChart and he can print it out, sign it, and re-upload it via MyChart. Otherwise, he can drop it off at the office. Pt verbalizes understanding and denies any questions at this time.

## 2023-01-14 NOTE — Telephone Encounter (Signed)
 Sleep Study and PAP order faxed on 01/13/23

## 2023-02-18 NOTE — Telephone Encounter (Signed)
Per [employer] portal, setup date was 02/01/23. Patient will need a compliance appointment in 6-8 weeks with Dr. Tresa Endo. Routed to scheduler for assistance.

## 2023-03-02 NOTE — Telephone Encounter (Signed)
 Sleep compliance appointment has been scheduled for 03/16/23 with Dr. Tresa Endo.

## 2023-03-16 ENCOUNTER — Ambulatory Visit: Payer: Medicare Other | Attending: Cardiovascular Disease | Admitting: Cardiovascular Disease

## 2023-03-16 ENCOUNTER — Encounter: Payer: Self-pay | Admitting: Cardiovascular Disease

## 2023-03-16 VITALS — BP 155/66 | HR 62 | Ht 69.0 in | Wt 162.6 lb

## 2023-03-16 DIAGNOSIS — R931 Abnormal findings on diagnostic imaging of heart and coronary circulation: Secondary | ICD-10-CM | POA: Diagnosis not present

## 2023-03-16 DIAGNOSIS — I358 Other nonrheumatic aortic valve disorders: Secondary | ICD-10-CM | POA: Diagnosis not present

## 2023-03-16 DIAGNOSIS — E785 Hyperlipidemia, unspecified: Secondary | ICD-10-CM | POA: Diagnosis not present

## 2023-03-16 DIAGNOSIS — G4733 Obstructive sleep apnea (adult) (pediatric): Secondary | ICD-10-CM | POA: Diagnosis not present

## 2023-03-16 NOTE — Progress Notes (Unsigned)
 Cardiology Office Note    Date:  03/18/2023   ID:  Patrick Carroll, DOB 1940/04/29, MRN [id number]  PCP:  Gaspar Garbe, MD  Cardiologist:  Nicki Guadalajara, MD   20 month cardiology evaluation  History of Present Illness:  Patrick Carroll is a 83 y.o. male who is followed by Dr. Wylene Simmer at [facility] medical primary care.  I saw him for my initial cardiology evaluation on March 11, 2021 and last saw him on July 21, 2021.  He presents for follow-up evaluation.   Patrick Carroll admits to being very active and in October 2022 had an episode of weakness and possible syncope on heavy exertion while walking and climbing.  On one occasion 20 years previously he had a similar episode and was felt to be dehydration or possible vagal response.  Remotely, he had been on statin therapy.  He denies any episodes of chest pain or shortness of breath.  Laboratory on September 10, 2020 showed significant cholesterol elevation at 248 with HDL 46, LDL 186, triglycerides 82.  He was started on rosuvastatin 5 mg.  He was referred for a coronary artery calcium score which revealed an agate stent score of 517 representing 56 percentile based on age race and gender.  Calcification in the left main was 58.9, LAD 17.4, circumflex 56.4, and RCA 229.  He also was noted to have elevated left hemidiaphragm with juxta diaphragmatic atelectasis and this finding was similar to prior CT assessment from 2013.  He had a small hiatal hernia.  Repeat laboratory on January 23 showed improvement of LDL cholesterol to 105 and since that time his rosuvastatin dose was further titrated to 10 mg.  He continues to be active and rock climbs several times per month at [employer].  He has a history of obstructive sleep apnea and has been on CPAP therapy for 2 years with his DME company [employer].  He has been followed by a physician in [facility].  CPAP compliance is 100%.  During that evaluation, I had an extensive discussion with him regarding  subclinical atherosclerosis with aggressive treatment contributing to plaque stability and potential plaque regression.  I recommended aggressive lipid-lowering therapy with target LDL less than 70 and discussed potential need for further titration of his rosuvastatin.  I suggested a LP(a) be obtained at the time of his next lipid studies I recommended he undergo 2D echo Doppler study to evaluate systolic and diastolic function as well as valvular architecture.  I last saw him on July 21, 2021 at which time he continued to be active.  His blood pressure at home has been stable.  LP(a) was normal at 41.8.  His echo Doppler study from March 24, 2021 showed normal LV function with EF 55 to 60%.  He had normal wall motion and diastolic parameters.  There was mild aortic valve sclerosis without stenosis.  With his elevated calcium score I recommended that he increase rosuvastatin to 20 mg.  He denies any chest pain or recurrent episodes of presyncope.  He is tolerating rosuvastatin 20 mg.  He takes Pepcid for GERD.  He is scheduled to see Dr. Wylene Simmer in follow-up in September and repeat laboratory will be obtained.    Since I last saw him, he was seen by Robet Leu, PA-C in July 2024.  He was tolerating Crestor 20 mg daily and lipid panel in September 2023 showed LDL at 74, total cholesterol 127, HDL 43 and triglycerides 48.  He was continuing to  use CPAP.  Presently, Patrick Carroll feels well.  He has continued to be on rosuvastatin 20 mg for hyperlipidemia and takes Pepcid daily for GERD.  He has not had recent laboratory.  He has continued to use CPAP which is followed by his physician in [facility].  He remains active.  He presents for evaluation.   Medical history is notable for BPH, GERD, OSA he had COVID in 2022 treated with Paxlovid.  Past surgical history is notable for cataract surgery  Current Medications: Outpatient Medications Prior to Visit  Medication Sig Dispense Refill   b complex  vitamins capsule Take 1 capsule by mouth daily.     Carbonyl Iron 15 MG CHEW Chew 77 mg by mouth as needed.     Cyanocobalamin (B-12) 500 MCG TABS take 2 a day Oral     Melatonin 3 MG CAPS Take 3 mg by mouth at bedtime.     Multiple Vitamin (MULITIVITAMIN WITH MINERALS) TABS Take 1 tablet by mouth daily.     Simethicone (BICARSIM FORTE) 125 MG TABS Take by mouth.     Tadalafil (CIALIS) 2.5 MG TABS Take by mouth.     famotidine (PEPCID) 10 MG tablet Take 10 mg by mouth 2 (two) times daily. (Patient not taking: Reported on 03/16/2023)     psyllium (REGULOID) 0.52 g capsule Take 0.52 g by mouth daily. (Patient not taking: Reported on 03/16/2023)     rosuvastatin (CRESTOR) 20 MG tablet Take 20 mg by mouth daily. (Patient not taking: Reported on 03/16/2023)     No facility-administered medications prior to visit.     Allergies:   Penicillins   Social History   Socioeconomic History   Marital status: Married    Spouse name: Not on file   Number of children: Not on file   Years of education: Not on file   Highest education level: Not on file  Occupational History   Not on file  Tobacco Use   Smoking status: Former    Current packs/day: 0.00    Types: Cigarettes    Quit date: 65    Years since quitting: 46.2   Smokeless tobacco: Never  Vaping Use   Vaping status: Never Used  Substance and Sexual Activity   Alcohol use: Not Currently   Drug use: Not Currently   Sexual activity: Yes    Partners: Female    Comment: married  Other Topics Concern   Not on file  Social History Narrative   Not on file   Social Drivers of Health   Financial Resource Strain: Not on file  Food Insecurity: Not on file  Transportation Needs: Not on file  Physical Activity: Not on file  Stress: Not on file  Social Connections: Not on file    He was born in [place], [place].  He had been a paramedic for [employer].  He had remotely smoked and quit in 1975.  He is married for 60 years and has  3 children, and 7 grandchildren.  Family History: Family history is notable for both parents being deceased.  His mother died at age 30 and had previously undergone CABG and had heart failure.  Father died at age 59 with Alzheimer's disease.  A brother died with: CVA.  He has 3 children.  ROS General: Negative; No fevers, chills, or night sweats;  HEENT: Negative; No changes in vision or hearing, sinus congestion, difficulty swallowing Pulmonary: Negative; No cough, wheezing, shortness of breath, hemoptysis Cardiovascular: See HPI GI:  Negative; No nausea, vomiting, diarrhea, or abdominal pain GU: Negative; No dysuria, hematuria, or difficulty voiding Musculoskeletal: Negative; no myalgias, joint pain, or weakness Hematologic/Oncology: Negative; no easy bruising, bleeding Endocrine: Negative; no heat/cold intolerance; no diabetes Neuro: Negative; no changes in balance, headaches Skin: Negative; No rashes or skin lesions Psychiatric: Negative; No behavioral problems, depression Sleep: Positive for OSA on CPAP  Other comprehensive 14 point system review is negative.   PHYSICAL EXAM:   VS:  BP (!) 155/66   Pulse 62   Ht 5\' 9"  (1.753 m)   Wt 162 lb 9.6 oz (73.8 kg)   SpO2 97%   BMI 24.01 kg/m     Repeat blood pressure by me was 118/68  Wt Readings from Last 3 Encounters:  03/16/23 162 lb 9.6 oz (73.8 kg)  07/23/22 158 lb 6.4 oz (71.8 kg)  07/21/21 162 lb 12.8 oz (73.8 kg)    General: Alert, oriented, no distress.  Skin: normal turgor, no rashes, warm and dry HEENT: Normocephalic, atraumatic. Pupils equal round and reactive to light; sclera anicteric; extraocular muscles intact;  Nose without nasal septal hypertrophy Mouth/Parynx benign; Mallinpatti scale 2 Neck: No JVD, no carotid bruits; normal carotid upstroke Lungs: clear to ausculatation and percussion; no wheezing or rales Chest wall: without tenderness to palpitation Heart: PMI not displaced, RRR, s1 s2 normal, 1/6  systolic murmur, no diastolic murmur, no rubs, gallops, thrills, or heaves Abdomen: soft, nontender; no hepatosplenomehaly, BS+; abdominal aorta nontender and not dilated by palpation. Back: no CVA tenderness Pulses 2+ Musculoskeletal: full range of motion, normal strength, no joint deformities Extremities: no clubbing cyanosis or edema, Homan's sign negative  Neurologic: grossly nonfocal; Cranial nerves grossly wnl Psychologic: Normal mood and affect    Studies/Labs Reviewed:   EKG Interpretation Date/Time:  Wednesday March 16 2023 12:10:08 EDT Ventricular Rate:  62 PR Interval:  142 QRS Duration:  94 QT Interval:  386 QTC Calculation: 391 R Axis:   -24  Text Interpretation: Normal sinus rhythm Increased R/S ratio in V1, consider early transition or posterior infarct When compared with ECG of 23-Jul-2022 10:53, No significant change was found Confirmed by Nicki Guadalajara ([id number]) on 03/16/2023 1:44:27 PM     July 21, 2021 ECG (independently read by me): NSR at 68, IRBBB, no ectopy  March 11, 2021 ECG (independently read by me):  NSR at 61; Early transition, mild RV conduction delay  Recent Labs:    Latest Ref Rng & Units 05/10/2016    8:32 AM 06/02/2011    4:31 PM 01/21/2010    4:22 AM  BMP  Glucose 65 - 99 mg/dL 604  540  981   BUN 6 - 20 mg/dL 15  18  25    Creatinine 0.61 - 1.24 mg/dL 1.91  4.78  1.1   Sodium 135 - 145 mmol/L 140  136  138   Potassium 3.5 - 5.1 mmol/L 4.2  4.4  4.1   Chloride 101 - 111 mmol/L 103  100  101   CO2 22 - 32 mmol/L 30  25    Calcium 8.9 - 10.3 mg/dL 8.7  7.6          Latest Ref Rng & Units 06/02/2011    4:31 PM  Hepatic Function  Total Protein 6.0 - 8.3 g/dL 5.8   Albumin 3.5 - 5.2 g/dL 3.0   AST 0 - 37 U/L 16   ALT 0 - 53 U/L 14   Alk Phosphatase 39 - 117 U/L 67  Total Bilirubin 0.3 - 1.2 mg/dL 0.3        Latest Ref Rng & Units 05/10/2016    8:32 AM 06/02/2011    4:31 PM 01/21/2010   10:06 PM  CBC  WBC 4.0 - 10.5 K/uL 7.1  10.6  7.5    Hemoglobin 13.0 - 17.0 g/dL 16.1  09.6  04.5   Hematocrit 39.0 - 52.0 % 39.4  32.1  38.4   Platelets 150 - 400 K/uL 184  200  217    Lab Results  Component Value Date   MCV 81.2 05/10/2016   MCV 76.8 (L) 06/02/2011   MCV 78.4 01/21/2010   Lab Results  Component Value Date   TSH 3.404 01/21/2010   Lab Results  Component Value Date   HGBA1C (H) 01/21/2010    6.3 (NOTE)                                                                       According to the ADA Clinical Practice Recommendations for 2011, when HbA1c is used as a screening test:   >=6.5%   Diagnostic of Diabetes Mellitus           (if abnormal result  is confirmed)  5.7-6.4%   Increased risk of developing Diabetes Mellitus  References:Diagnosis and Classification of Diabetes Mellitus,Diabetes Care,2011,34(Suppl 1):S62-S69 and Standards of Medical Care in         Diabetes - 2011,Diabetes Care,2011,34  (Suppl 1):S11-S61.     BNP No results found for: "BNP"  ProBNP    Component Value Date/Time   PROBNP <30.0 01/21/2010 0806     Lipid Panel     Component Value Date/Time   CHOL 167 03/13/2021 0836   TRIG 78 03/13/2021 0836   HDL 53 03/13/2021 0836   CHOLHDL 3.2 03/13/2021 0836   CHOLHDL 3.4 01/21/2010 0646   VLDL 19 01/21/2010 0646   LDLCALC 99 03/13/2021 0836   LABVLDL 15 03/13/2021 0836     RADIOLOGY: No results found.   Additional studies/ records that were reviewed today include:   10/09/2020 FINDINGS: CORONARY CALCIUM SCORES:   Left Main: 58.9   LAD: 17.4   LCx: 56.4   RCA: 229   Total Agatston Score: 517   MESA database percentile: 56   AORTA MEASUREMENTS:   Ascending Aorta: 34 mm   Descending Aorta: 29 mm   OTHER FINDINGS:   Cardiovascular: Signs of aortic atherosclerosis. Heart size normal without substantial pericardial effusion. Central pulmonary vasculature normal caliber. Limited assessment of cardiovascular structures given lack of intravenous contrast.    Mediastinum/Nodes: Small hiatal hernia. Visualized mediastinal structures are otherwise unremarkable, no adenopathy within the visible portions of the chest.   Lungs/Pleura: Elevated LEFT hemidiaphragm with juxta diaphragmatic atelectasis. No effusion. No consolidation in visualized portions of the chest aside from mild atelectatic changes.   Upper Abdomen: Limited imaging of upper abdominal contents without acute findings, moderate to marked elevation of LEFT hemidiaphragm.   Musculoskeletal: Spinal degenerative changes without acute or destructive bone process   IMPRESSION: Total Agatston score of 517. This places the patient in the 56th percentile for age, gender and race/ethnicity.   Elevated LEFT hemidiaphragm with juxta diaphragmatic atelectasis. Finding similar to scout images from CT assessments from  2013.   Small hiatal hernia.     Electronically Signed   By: Donzetta Kohut M.D.   On: 10/10/2020 15:36   ECHO: 03/24/2021  1. Left ventricular ejection fraction, by estimation, is 55 to 60%. The  left ventricle has normal function. The left ventricle has no regional  wall motion abnormalities. Left ventricular diastolic parameters were  normal.   2. Right ventricular systolic function is normal. The right ventricular  size is normal.   3. The mitral valve is normal in structure. No evidence of mitral valve  regurgitation.   4. The aortic valve is tricuspid. There is mild calcification of the  aortic valve. There is mild thickening of the aortic valve. Aortic valve  regurgitation is not visualized. Aortic valve sclerosis/calcification is  present, without any evidence of  aortic stenosis.   5. The inferior vena cava is normal in size with greater than 50%  respiratory variability, suggesting right atrial pressure of 3 mmHg.    ASSESSMENT:    1. 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 Hyperlipidemia with target LDL less than 70   3.  Aortic valve sclerosis   4. 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  He has a history of hyperlipidemia and remotely had been on statin therapy.  Laboratory in September 2022 showed marked elevation of LDL cholesterol at 186 which raises concern for possible familial etiology.  He was started on low-dose rosuvastatin at 5 mg.  Coronary calcium score was elevated at 517 representing 56 percentile for age, gender and race.  He is without angina and admits to significant activity and continues to go rockclimbing several times per month at [employer].  In October, he had an episode of lightheadedness with possible brief syncope which may have been associated with dehydration.  His ECG is stable with sinus rhythm and early transition.  He denies any chest pain, exertional dyspnea, or palpitations.  He has a history of GERD for which he takes famotidine.  He takes tadalafil for his prostate hypertrophy.  He uses CPAP with 100% compliance and has been on therapy for 2 years.  At my initial evaluation I recommended he undergo an LP(a) evaluation.  This revealed a normal level at 41.8.  He underwent a 2D echo Doppler on March 24, 2021 which showed normal LV function with EF 55 to 60% without wall motion abnormalities.  He had normal diastolic parameters.  There was mild aortic valve calcification with aortic sclerosis without stenosis.  Presently, he continues to be stable.  His blood pressure was increased slightly upon arrival but on repeat by me was 118/68.  He had laboratory with Dr. Wylene Simmer in September 2024 which showed an LDL cholesterol at 83.  He has continued to be on rosuvastatin 20 mg.  I have recommended that he have repeat laboratory and not wait until September for labs to be checked by Dr. Wylene Simmer.  With his coronary calcification I have recommended target LDL at minimum less than 70% and preferably less than 55%.  As  result he will recheck comprehensive metabolic panel, fasting lipid panel next week at Dr. Deneen Harts office when he presents for his 60-month evaluation.  He continues to use CPAP with 100% compliance.  I provided him with a new ResMed AirFit N30i mask sample today.  I discussed my plans for retirement later this year.  I  will transition him to the care of Dr. Leatrice Jewels for cardiology follow-up in 1 year or sooner as neededc   Medication Adjustments/Labs and Tests Ordered: Current medicines are reviewed at length with the patient today.  Concerns regarding medicines are outlined above.  Medication changes, Labs and Tests ordered today are listed in the Patient Instructions below. Patient Instructions  Medication Instructions:  No medication changes were made during today's visit.  *If you need a refill on your cardiac medications before your next appointment, please call your pharmacy*   Lab Work: If you have labs (blood work) drawn today and your tests are completely normal, you will receive your results only by: MyChart Message (if you have MyChart) OR A paper copy in the mail If you have any lab test that is abnormal or we need to change your treatment, we will call you to review the results.   Testing/Procedures: No procedures were ordered during today's visit.    Follow-Up: At [facility], you and your health needs are our priority.  As part of our continuing mission to provide you with exceptional heart care, we have created designated Provider Care Teams.  These Care Teams include your primary Cardiologist (physician) and Advanced Practice Providers (APPs -  Physician Assistants and Nurse Practitioners) who all work together to provide you with the care you need, when you need it.  We recommend signing up for the patient portal called "MyChart".  Sign up information is provided on this After Visit Summary.  MyChart is used to connect with patients for Virtual Visits  (Telemedicine).  Patients are able to view lab/test results, encounter notes, upcoming appointments, etc.  Non-urgent messages can be sent to your provider as well.   To learn more about what you can do with MyChart, go to [email or web address].    Your next appointment:   1 year(s)  Provider:   Dr. Armanda Magic   Other Instructions        1st Floor: - Lobby - Registration  - Pharmacy  - Lab - Cafe   2nd Floor: - PV Lab - Diagnostic Testing (echo, CT, nuclear med)   3rd Floor: - Vacant   4th Floor: - TCTS (cardiothoracic surgery) - AFib Clinic - Structural Heart Clinic - Vascular Surgery  - Vascular Ultrasound   5th Floor: - HeartCare Cardiology (general and EP) - Clinical Pharmacy for coumadin, hypertension, lipid, weight-loss medications, and med management appointments      Valet parking services will be available as well.       Thank you for choosing [facility]!       Signed, Nicki Guadalajara, MD  03/18/2023 10:21 AM    [facility] [address], [address], [place], [place]  [address] Phone: [phone]

## 2023-03-16 NOTE — Patient Instructions (Signed)
 Medication Instructions:  No medication changes were made during today's visit.  *If you need a refill on your cardiac medications before your next appointment, please call your pharmacy*   Lab Work: If you have labs (blood work) drawn today and your tests are completely normal, you will receive your results only by: MyChart Message (if you have MyChart) OR A paper copy in the mail If you have any lab test that is abnormal or we need to change your treatment, we will call you to review the results.   Testing/Procedures: No procedures were ordered during today's visit.    Follow-Up: At [facility], you and your health needs are our priority.  As part of our continuing mission to provide you with exceptional heart care, we have created designated Provider Care Teams.  These Care Teams include your primary Cardiologist (physician) and Advanced Practice Providers (APPs -  Physician Assistants and Nurse Practitioners) who all work together to provide you with the care you need, when you need it.  We recommend signing up for the patient portal called "MyChart".  Sign up information is provided on this After Visit Summary.  MyChart is used to connect with patients for Virtual Visits (Telemedicine).  Patients are able to view lab/test results, encounter notes, upcoming appointments, etc.  Non-urgent messages can be sent to your provider as well.   To learn more about what you can do with MyChart, go to [email or web address].    Your next appointment:   1 year(s)  Provider:   Dr. Armanda Magic   Other Instructions        1st Floor: - Lobby - Registration  - Pharmacy  - Lab - Cafe   2nd Floor: - PV Lab - Diagnostic Testing (echo, CT, nuclear med)   3rd Floor: - Vacant   4th Floor: - TCTS (cardiothoracic surgery) - AFib Clinic - Structural Heart Clinic - Vascular Surgery  - Vascular Ultrasound   5th Floor: - HeartCare Cardiology (general and EP) - Clinical  Pharmacy for coumadin, hypertension, lipid, weight-loss medications, and med management appointments      Valet parking services will be available as well.       Thank you for choosing [facility]!

## 2023-03-18 ENCOUNTER — Encounter: Payer: Self-pay | Admitting: Cardiovascular Disease

## 2023-05-12 ENCOUNTER — Ambulatory Visit: Admitting: Dermatology

## 2023-06-01 ENCOUNTER — Ambulatory Visit: Admitting: Dermatology

## 2023-06-01 ENCOUNTER — Encounter: Payer: Self-pay | Admitting: Dermatology

## 2023-06-01 VITALS — BP 170/102

## 2023-06-01 DIAGNOSIS — D229 Melanocytic nevi, unspecified: Secondary | ICD-10-CM

## 2023-06-01 DIAGNOSIS — L570 Actinic keratosis: Secondary | ICD-10-CM

## 2023-06-01 DIAGNOSIS — L814 Other melanin hyperpigmentation: Secondary | ICD-10-CM

## 2023-06-01 DIAGNOSIS — D1801 Hemangioma of skin and subcutaneous tissue: Secondary | ICD-10-CM

## 2023-06-01 DIAGNOSIS — W908XXA Exposure to other nonionizing radiation, initial encounter: Secondary | ICD-10-CM

## 2023-06-01 DIAGNOSIS — L821 Other seborrheic keratosis: Secondary | ICD-10-CM | POA: Diagnosis not present

## 2023-06-01 DIAGNOSIS — L578 Other skin changes due to chronic exposure to nonionizing radiation: Secondary | ICD-10-CM

## 2023-06-01 DIAGNOSIS — Z1283 Encounter for screening for malignant neoplasm of skin: Secondary | ICD-10-CM

## 2023-06-01 NOTE — Patient Instructions (Addendum)

## 2023-06-01 NOTE — Progress Notes (Signed)
   Total Body Skin Exam (TBSE) Visit   Subjective  Patrick Carroll is a 83 y.o. male who presents for the following: Skin Cancer Screening and Full Body Skin Exam  Patient presents today for follow up visit for TBSE. Patient was last evaluated on 03/18/22 . Patient denies medication changes. Patient reports he  does not have spots, moles and lesions of concern to be evaluated. Patient reports throughout his lifetime he  has had complete sun exposure. Currently, patient reports if he  has excessive sun exposure, he  does not apply sunscreen and/or wears protective coverings. Patient reports he  does hx of bx (benign). Patient denies  family history of skin cancers. The patient has spots, moles and lesions to be evaluated, some may be new or changing and the patient has concerns that these could be cancer.  The following portions of the chart were reviewed this encounter and updated as appropriate: medications, allergies, medical history  Review of Systems:  No other skin or systemic complaints except as noted in HPI or Assessment and Plan.  Objective  Well appearing patient in no apparent distress; mood and affect are within normal limits.  A full examination was performed including scalp, head, eyes, ears, nose, lips, neck, chest, axillae, abdomen, back, buttocks, bilateral upper extremities, bilateral lower extremities, hands, feet, fingers, toes, fingernails, and toenails. All findings within normal limits unless otherwise noted below.   Relevant physical exam findings are noted in the Assessment and Plan.  L arm, Mid center back (6) Erythematous thin papules/macules with gritty scale.   Assessment & Plan   LENTIGINES, SEBORRHEIC KERATOSES, HEMANGIOMAS - Benign normal skin lesions - Benign-appearing - Call for any changes  BENIGN MELANOCYTIC NEVI - Tan-brown and/or pink-flesh-colored symmetric macules and papules - Benign appearing on exam today - Observation - Call clinic for new or  changing moles - Recommend daily use of broad spectrum spf 30+ sunscreen to sun-exposed areas.   MILD ACTINIC DAMAGE - Chronic condition, secondary to cumulative UV/sun exposure - diffuse scaly erythematous macules with underlying dyspigmentation - Recommend daily broad spectrum sunscreen SPF 30+ to sun-exposed areas, reapply every 2 hours as needed.  - Staying in the shade or wearing long sleeves, sun glasses (UVA+UVB protection) and wide brim hats (4-inch brim around the entire circumference of the hat) are also recommended for sun protection.  - Call for new or changing lesions.   SKIN CANCER SCREENING PERFORMED TODAY.  AK (ACTINIC KERATOSIS) (6) L arm, Mid center back (6) Destruction of lesion - L arm, Mid center back (6) Complexity: simple   Destruction method: cryotherapy   Informed consent: discussed and consent obtained   Timeout:  patient name, date of birth, surgical site, and procedure verified Lesion destroyed using liquid nitrogen: Yes   Region frozen until ice ball extended beyond lesion: Yes   Outcome: patient tolerated procedure well with no complications   Post-procedure details: wound care instructions given     Return in about 1 year (around 05/31/2024) for TBSE.   Documentation: I have reviewed the above documentation for accuracy and completeness, and I agree with the above.  I, Shirron Louanne Roussel, CMA, am acting as scribe for [employer], DO.   Louana Roup, DO

## 2023-08-13 NOTE — Progress Notes (Deleted)
 Cardiology Office Note   Date:  08/13/2023  ID:  Patrick Carroll, Patrick Carroll 13-Oct-1940, MRN [id number] PCP: Tisovec, Richard W, MD  [facility] Providers Cardiologist:  Debby Sor, MD (Inactive)    History of Present Illness Patrick Carroll is a 83 y.o. male with a past medical history of elevated coronary calcium score, OSA on CPAP, HLD, aortic valve sclerosis, hiatal hernia. Patient is followed by Dr. Sor and presents today for annual evaluation    Per chart review, patient first established care with Dr. Sor in 03/2021 for evaluation of elevated coronary calcium score. Labs in 09/2020 showed LDL elevated to 186, and he was started on crestor 5 mg daily. He underwent coronary calcium scoring that showed a score of 517 (56th percentile). Repeat labs in 01/2021 showed LDL had improved to 105, and his crestor was increased to 10 mg daily. He reported excellent compliance with his CPAP, and he was very active (rock climbed several times per month.) When evaluated by Dr. Sor, he had a systolic murmur on exam. Underwent echocardiogram 03/24/21 that showed EF 55-60%, no regional wall motion abnormalities, normal LV diastolic parameters, normal RV systolic function, mild aortic calcification without evidence of aortic stenosis.    Patient was last seen by Dr. Sor on 07/21/21. At that time, patient's crestor was increased to 20 mg daily.    I saw patient clinic 07/23/2022.  At that time, patient was doing very well from a cardiac perspective.  Stays active by doing yoga and tai chi.  Also went to a rock climbing gym 2 times per week.  BP well-controlled.  Elevated Coronary Calcium Score  HLD  - Coronary calcium score was 517 in 02/2021 (56th percentile)  - Patient is active- does yoga, rock climbs twice a week. No chest pain or shortness of breath on exertion.  - Patient has been on crestor 20 mg daily- most recent lipid panel from 09/2021 showed LDL74, HDL 43, triglycerides 48, total cholesterol  127. Lipoprotein a 41.8  - Continue crestor 20 mg daily for risk reduction    OSA on CPAP  - Patient reports excellent compliance with CPAP - Echocardiogram in 03/2021 showed normal RV systolic function, normal ?RV size and wall thickness   - He has had his current CPAP machine for 7 years. His machine is getting old and has expected wear and tear. Needs a new CPAP  - Patient brought his compliance report and device information today to clinic - has been 100% compliant   Aortic Valve sclerosis  - Noted on echocardiogram from 03/2021. No aortic stenosis noted - Continues to have murmur on exam. No chest pain, dyspnea, syncope/near syncope. No need to repeat echo at this time   ROS: ***  Studies Reviewed      *** Risk Assessment/Calculations {Does this patient have ATRIAL FIBRILLATION?:[phone]} No BP recorded.  {Refresh Note OR Click here to enter BP  :1}***       Physical Exam VS:  There were no vitals taken for this visit.       Wt Readings from Last 3 Encounters:  03/16/23 162 lb 9.6 oz (73.8 kg)  07/23/22 158 lb 6.4 oz (71.8 kg)  07/21/21 162 lb 12.8 oz (73.8 kg)    GEN: Well nourished, well developed in no acute distress NECK: No JVD; No carotid bruits CARDIAC: ***RRR, no murmurs, rubs, gallops RESPIRATORY:  Clear to auscultation without rales, wheezing or rhonchi  ABDOMEN: Soft, non-tender, non-distended EXTREMITIES:  No edema; No  deformity   ASSESSMENT AND PLAN ***    {Are you ordering a CV Procedure (e.g. stress test, cath, DCCV, TEE, etc)?   Press F2        :[id number]}  Dispo: ***  Signed, Patrick FABIENE Louder, PA-C

## 2023-08-24 NOTE — Progress Notes (Unsigned)
 Cardiology Office Note:    Date:  08/25/2023   ID:  Patrick Carroll, DOB [birth year], MRN [id number]  PCP:  Vernadine Charlie ORN, MD   [facility] Providers Cardiologist:  Lonni LITTIE Nanas, MD     Referring MD: Vernadine Charlie ORN, MD   Chief complaint: Annual follow-up  History of Present Illness:    Patrick ARQUETTE is a 83 y.o. male with a hx of elevated coronary calcium score, OSA on CPAP, HLD, aortic valve sclerosis, hiatal hernia.  Patient had an episode of weakness and possible syncope on heavy exertion while walking and climbing in October 2022.  Lipid panel was elevated at that time, started on 5 mg rosuvastatin and referred her for a coronary artery calcium score, which resulted at 517 (56% based on age, race, gender).  Multivessel calcifications were noted on this study.  100% compliance was reported with his CPAP, was a very active rock climber at that time.  He was evaluated by Dr. Burnard with cardiology on March 11, 2021, where they discussed aggressive lipid-lowering therapy and need for echocardiogram.  Echo 03/24/2021 showed EF 55-60%, no RWMA, normal LV diastolic parameters, normal RV systolic function, mild aortic calcification without evidence of aortic stenosis.   In July/2023 his rosuvastatin was increased to 20 mg daily, patient is taking Pepcid for his GERD.  Last seen by Dr. Burnard on 03/16/2023, patient was doing well, continues to be stable, still compliant with his CPAP and medications.  Dr. Burnard recommended his transition of care to Dr. Lonni Mas for cardiac follow-up.  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 Patrick Carroll is still very active, rock climbs regularly.  Primary concern today is increased episodes of syncope. Denies chest pain, shortness of breath, palpitations, dark/bloody stools,  dizziness, leg swelling. Reports these episodes have happened sporadically throughout his life, increasing in frequency over the last 3 years.  States this was the reason he was referred to Dr. Burnard initially, reports Dr. Burnard ordered a stress test in 2012 and it was negative. 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  States he has not lost consciousness from 1 of these episodes in at least 2 years.  When he has these episodes his blood pressure drops, gets very pale, wife reported dusky lips, near syncopal, diaphoretic.  Laying down and elevating his feet help to prevent him from fully losing consciousness if he can catch it early enough. Reports these episodes are always triggered by exertion and dehydration, never occurred at rest. Patient states that his mother had similar syncopal episodes and required a CABG. Patient reports he continues 100% compliance on his CPAP.  Patient's most recently updated labs were from September of last year performed by PCP, will have scanned into our system.  States he has his yearly physical in 2 weeks and would prefer to have his blood work drawn with his PCP, states he will have the results faxed to our office at that time.    History reviewed. No pertinent past medical history.  History reviewed. No pertinent surgical history.  Current Medications: Current Meds  Medication Sig   famotidine (PEPCID) 10 MG tablet Take 10 mg by mouth 2 (two) times daily.  Melatonin 3 MG CAPS Take 3 mg by mouth at bedtime.   rosuvastatin (CRESTOR) 20 MG tablet Take 20 mg by mouth daily.   Simethicone (BICARSIM FORTE) 125 MG TABS Take by mouth.   Tadalafil (CIALIS) 2.5 MG TABS Take by mouth.   [DISCONTINUED] OVER THE COUNTER MEDICATION  (Patient taking differently: Ocuvite for eyes taking daily)     Allergies:   Penicillins   Social History    Socioeconomic History   Marital status: Married    Spouse name: Not on file   Number of children: Not on file   Years of education: Not on file   Highest education level: Not on file  Occupational History   Not on file  Tobacco Use   Smoking status: Former    Current packs/day: 0.00    Types: Cigarettes    Quit date: 13    Years since quitting: 46.6   Smokeless tobacco: Never  Vaping Use   Vaping status: Never Used  Substance and Sexual Activity   Alcohol use: Not Currently   Drug use: Not Currently   Sexual activity: Yes    Partners: Female    Comment: married  Other Topics Concern   Not on file  Social History Narrative   Not on file   Social Drivers of Health   Financial Resource Strain: Not on file  Food Insecurity: Not on file  Transportation Needs: Not on file  Physical Activity: Not on file  Stress: Not on file  Social Connections: Not on file     Family History: The patient's family history is not on file.  ROS:   Please see the history of present illness.     All other systems reviewed and are negative.  EKGs/Labs/Other Studies Reviewed:    The following studies were reviewed today:  EKG Interpretation Date/Time:  Thursday August 25 2023 16:14:39 EDT Ventricular Rate:  60 PR Interval:  154 QRS Duration:  100 QT Interval:  404 QTC Calculation: 404 R Axis:   -24  Text Interpretation: Normal sinus rhythm Normal ECG  Unchanged when compared to prior studies Confirmed by Dandra Velardi [phone]) on 08/25/2023 4:48:07 PM    Recent Labs: No results found for requested labs within last 365 days.  Recent Lipid Panel    Component Value Date/Time   CHOL 167 03/13/2021 0836   TRIG 78 03/13/2021 0836   HDL 53 03/13/2021 0836   CHOLHDL 3.2 03/13/2021 0836   CHOLHDL 3.4 01/21/2010 0646   VLDL 19 01/21/2010 0646   LDLCALC 99 03/13/2021 0836        Physical Exam:    VS:  BP 120/70   Pulse 70   Ht 5' 9.5 (1.765 m)   Wt 155 lb (70.3 kg)    SpO2 97%   BMI 22.56 kg/m     Wt Readings from Last 3 Encounters:  08/25/23 155 lb (70.3 kg)  03/16/23 162 lb 9.6 oz (73.8 kg)  07/23/22 158 lb 6.4 oz (71.8 kg)     GEN:  Well nourished, well developed in no acute distress HEENT: Normal NECK: No carotid bruits CARDIAC: S1-S2 normal, RRR, no murmurs, rubs, gallops, 2+ DP/PT RESPIRATORY:  Clear to auscultation without rales, wheezing or rhonchi  MUSCULOSKELETAL:  No edema; No deformity  SKIN: Warm and dry NEUROLOGIC:  Alert and oriented x 3 PSYCHIATRIC:  Normal affect   ASSESSMENT:    1. Syncope and collapse   2. OSA (obstructive sleep apnea)   3. Hyperlipidemia, unspecified hyperlipidemia type  PLAN:    In order of problems listed above:  Syncope and collapse -These episodes have happen sporadically throughout his life, initiated through exertion and dehydration.  On average happen once every 6 months -EKG in office today was normal sinus rhythm, 60 bpm, peaked T waves that were present on prior studies - Echo 03/24/2021 showed EF 55-60%, no RWMA, normal LV diastolic parameters, normal RV systolic function, mild aortic calcification without evidence of aortic stenosis.  -Coronary calcium score 10/09/2020: 517 (56% based on age, race, gender) -Nuclear stress test 01/22/2010 showed no reversible ischemia or infarction, normal wall motion, LVEF 65% -Will hold off on ordering stress test or coronary CTA at this time given that  symptoms do not appear ischemic in nature considering its brought on by dehydration, lack of chest pain, shortness of breath, and considering it has been intermittent and ongoing throughout the majority of his life. - Considering infrequency of events, have placed order for loop recorder and will refer patient to an EP specialist - Requested to run a CBC, CMP, lipid panel, patient declined, stating that he will have his annual physical exam with his PCP in 2 weeks. States he will have those results faxed to our  office or will drop them by himself.  OSA (obstructive sleep apnea) -100% compliance reported on a nightly basis  Hyperlipidemia - Managed by his PCP - On Crestor 20 mg daily - Has annual physical exam in 2 weeks, requests to have lab work performed there and faxed to our office.  Provided patient with our fax information.  Follow-up with the EP specialist in 2-3 months Follow-up with Dr. Kate in 4-6 months   Medication Adjustments/Labs and Tests Ordered: Current medicines are reviewed at length with the patient today.  Concerns regarding medicines are outlined above.  Orders Placed This Encounter  Procedures   Ambulatory referral to Cardiac Electrophysiology   EKG 12-Lead   ECHOCARDIOGRAM COMPLETE   LOOP RECORDER IMPLANT   No orders of the defined types were placed in this encounter.   Patient Instructions  Medication Instructions:  No changes *If you need a refill on your cardiac medications before your next appointment, please call your pharmacy*  Lab Work: Out fax number is [phone]  Testing Your physician has requested that you have an echocardiogram. Echocardiography is a painless test that uses sound waves to create images of your heart. It provides your doctor with information about the size and shape of your heart and how well your heart's chambers and valves are working. This procedure takes approximately one hour. There are no restrictions for this procedure. Please do NOT wear cologne, perfume, aftershave, or lotions (deodorant is allowed). Please arrive 15 minutes prior to your appointment time.  Please note: We ask at that you not bring children with you during ultrasound (echo/ vascular) testing. Due to room size and safety concerns, children are not allowed in the ultrasound rooms during exams. Our front office staff cannot provide observation of children in our lobby area while testing is being conducted. An adult accompanying a patient to their  appointment will only be allowed in the ultrasound room at the discretion of the ultrasound technician under special circumstances. We apologize for any inconvenience.  Follow-Up: At [facility], you and your health needs are our priority.  As part of our continuing mission to provide you with exceptional heart care, our providers are all part of one team.  This team includes your primary Cardiologist (physician) and Advanced Practice  Providers or APPs (Physician Assistants and Nurse Practitioners) who all work together to provide you with the care you need, when you need it.  Your next appointment:   4-6 Months  Provider:   Lonni LITTIE Nanas, MD    We recommend signing up for the patient portal called MyChart.  Sign up information is provided on this After Visit Summary.  MyChart is used to connect with patients for Virtual Visits (Telemedicine).  Patients are able to view lab/test results, encounter notes, upcoming appointments, etc.  Non-urgent messages can be sent to your provider as well.   To learn more about what you can do with MyChart, go to [email or web address].   Signed, Miriam FORBES Shams, NP  08/25/2023 5:42 PM    [facility]

## 2023-08-25 ENCOUNTER — Encounter: Payer: Self-pay | Admitting: Cardiology

## 2023-08-25 ENCOUNTER — Ambulatory Visit: Attending: Cardiology | Admitting: Emergency Medicine

## 2023-08-25 VITALS — BP 120/70 | HR 70 | Ht 69.5 in | Wt 155.0 lb

## 2023-08-25 DIAGNOSIS — E785 Hyperlipidemia, unspecified: Secondary | ICD-10-CM | POA: Diagnosis not present

## 2023-08-25 DIAGNOSIS — G4733 Obstructive sleep apnea (adult) (pediatric): Secondary | ICD-10-CM | POA: Diagnosis not present

## 2023-08-25 DIAGNOSIS — R55 Syncope and collapse: Secondary | ICD-10-CM | POA: Diagnosis not present

## 2023-08-25 NOTE — Patient Instructions (Addendum)
 Medication Instructions:  No changes *If you need a refill on your cardiac medications before your next appointment, please call your pharmacy*  Lab Work: Out fax number is [phone]  Testing Your physician has requested that you have an echocardiogram. Echocardiography is a painless test that uses sound waves to create images of your heart. It provides your doctor with information about the size and shape of your heart and how well your heart's chambers and valves are working. This procedure takes approximately one hour. There are no restrictions for this procedure. Please do NOT wear cologne, perfume, aftershave, or lotions (deodorant is allowed). Please arrive 15 minutes prior to your appointment time.  Please note: We ask at that you not bring children with you during ultrasound (echo/ vascular) testing. Due to room size and safety concerns, children are not allowed in the ultrasound rooms during exams. Our front office staff cannot provide observation of children in our lobby area while testing is being conducted. An adult accompanying a patient to their appointment will only be allowed in the ultrasound room at the discretion of the ultrasound technician under special circumstances. We apologize for any inconvenience.  Follow-Up: At [facility], you and your health needs are our priority.  As part of our continuing mission to provide you with exceptional heart care, our providers are all part of one team.  This team includes your primary Cardiologist (physician) and Advanced Practice Providers or APPs (Physician Assistants and Nurse Practitioners) who all work together to provide you with the care you need, when you need it.  Your next appointment:   4-6 Months  Provider:   Lonni LITTIE Nanas, MD    We recommend signing up for the patient portal called MyChart.  Sign up information is provided on this After Visit Summary.  MyChart is used to connect with patients for  Virtual Visits (Telemedicine).  Patients are able to view lab/test results, encounter notes, upcoming appointments, etc.  Non-urgent messages can be sent to your provider as well.   To learn more about what you can do with MyChart, go to [email or web address].

## 2023-09-28 ENCOUNTER — Ambulatory Visit (HOSPITAL_COMMUNITY)
Admission: RE | Admit: 2023-09-28 | Discharge: 2023-09-28 | Disposition: A | Discharge status: Home / Self Care | Source: Ambulatory Visit | Attending: Cardiovascular Disease | Admitting: Cardiovascular Disease

## 2023-09-28 DIAGNOSIS — R55 Syncope and collapse: Secondary | ICD-10-CM | POA: Insufficient documentation

## 2023-09-28 LAB — ECHOCARDIOGRAM COMPLETE
Area-P 1/2: 3.05 cm2
S' Lateral: 2 cm

## 2023-09-29 ENCOUNTER — Ambulatory Visit: Payer: Self-pay | Admitting: Emergency Medicine

## 2023-10-02 NOTE — Progress Notes (Unsigned)
 Electrophysiology Office Note:    Date:  10/03/2023   ID:  Patrick Carroll, DOB [birth year], MRN [id number]  PCP:  Vernadine Charlie ORN, MD   [facility] Providers Cardiologist:  Lonni LITTIE Nanas, MD     Referring MD: Elaine Miriam BRAVO, NP   History of Present Illness:    Patrick Carroll is a 83 y.o. male with a medical history significant for elevated coronary calcium, sleep apnea on CPAP, aortic valve sclerosis, hiatal hernia, referred for evaluation of syncope and possible loop recorder placement.       Discussed the use of AI scribe software for clinical note transcription with the patient, who gave verbal consent to proceed.  History of Present Illness He had an episode of possible syncope during heavy exertion in October 2022.  He had a coronary evaluation that showed significant calcification so he started on statin therapy.  He has had episodes of syncope over the years, increasingly so over the last 3 years.  Although he has had near syncope many times, he has actually lost consciousness only twice.  When he has these episodes, his blood pressure drops, he becomes very pale and cyanotic, near syncopal, and diaphoretic.  Lying down and elevating his feet helps to prevent him from fully losing consciousness.  Episodes are triggered by exertion and dehydration.   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  She has palpated a pulse during these episodes, and his pulse rate is low, never fast.         Today, he reports that he feels well and has no complaints  EKGs/Labs/Other Studies Reviewed Today:     Echocardiogram:  TTE September 2025 LVEF 55 to 60%.  Grade 1 diastolic dysfunction.  No significant structural or valvular disease to explain his syncope.   Advanced imaging:  CT cardiac scoring 2022 Agatston score of 517.    EKG:   EKG Interpretation Date/Time:  Monday October 03 2023 08:15:08 EDT Ventricular Rate:  65 PR  Interval:  162 QRS Duration:  100 QT Interval:  392 QTC Calculation: 407 R Axis:   -14  Text Interpretation: Normal sinus rhythm Increased R/S ratio in V1, consider early transition or posterior infarct When compared with ECG of 25-Aug-2023 16:14, No significant change was found Confirmed by Nancey Scotts [phone]) on 10/03/2023 8:39:21 AM     Physical Exam:    VS:  BP 118/70   Pulse 65   Ht 5' 9.5 (1.765 m)   Wt 162 lb 6.4 oz (73.7 kg)   SpO2 97%   BMI 23.64 kg/m     Wt Readings from Last 3 Encounters:  10/03/23 162 lb 6.4 oz (73.7 kg)  08/25/23 155 lb (70.3 kg)  03/16/23 162 lb 9.6 oz (73.8 kg)     GEN: Well nourished, well developed in no acute distress CARDIAC: RRR, no murmurs, rubs, gallops RESPIRATORY:  Normal work of breathing MUSCULOSKELETAL: no edema    ASSESSMENT & PLAN:     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 I have very low suspicion for arrhythmia given the orthostatic nature of his symptoms. I think a loop recorder would be of little benefit. He is very knowledgeable about health issues, yet we discussed the importance of hydration and lying down at onset of symptoms.  Obstructive sleep apnea Patient reports one 100 percent compliance    Signed, Eulas FORBES Furbish, MD  10/03/2023 9:07 AM    [facility]

## 2023-10-03 ENCOUNTER — Encounter: Payer: Self-pay | Admitting: Cardiovascular Disease

## 2023-10-03 ENCOUNTER — Ambulatory Visit: Attending: Cardiovascular Disease | Admitting: Cardiovascular Disease

## 2023-10-03 VITALS — BP 118/70 | HR 65 | Ht 69.5 in | Wt 162.4 lb

## 2023-10-03 DIAGNOSIS — R931 Abnormal findings on diagnostic imaging of heart and coronary circulation: Secondary | ICD-10-CM | POA: Diagnosis not present

## 2023-10-03 NOTE — Patient Instructions (Addendum)
 Medication Instructions:  Your physician recommends that you continue on your current medications as directed. Please refer to the Current Medication list given to you today.  *If you need a refill on your cardiac medications before your next appointment, please call your pharmacy*  Lab Work: None ordered.  If you have labs (blood work) drawn today and your tests are completely normal, you will receive your results only by: MyChart Message (if you have MyChart) OR A paper copy in the mail If you have any lab test that is abnormal or we need to change your treatment, we will call you to review the results.  Testing/Procedures: None ordered.   Follow-Up: At [facility], you and your health needs are our priority.  As part of our continuing mission to provide you with exceptional heart care, our providers are all part of one team.  This team includes your primary Cardiologist (physician) and Advanced Practice Providers or APPs (Physician Assistants and Nurse Practitioners) who all work together to provide you with the care you need, when you need it.  Your next appointment:   Follow up with Dr Mealor as needed

## 2023-12-04 NOTE — Progress Notes (Unsigned)
 Cardiology Office Note:    Date:  12/08/2023   ID:  Patrick Carroll, DOB 04/22/1940, MRN [id number]  PCP:  Tisovec, Richard W, MD  Cardiologist:  Lonni LITTIE Nanas, MD  Electrophysiologist:  None   Referring MD: Vernadine Charlie ORN, MD   Chief Complaint  Patient presents with   Coronary Artery Disease    History of Present Illness:    Patrick Carroll is a 83 y.o. male with a hx of CAD, OSA, hyperlipidemia who presents for follow-up.  Previously followed with Dr. Burnard.  Calcium score 10/10/2020 was 517 (56 percentile).  He was referred to EP for evaluation of syncope, felt to likely be neurocardiogenic.  Echocardiogram 09/2023 showed EF 55 to 60%, normal RV function, no significant valvular disease.  Since last clinic visit, he reports he is doing okay.  Denies any recent episodes of lightheadedness or syncope. Denies any chest pain, dyspnea, lower extremity edema, or palpitations.  Reports BP has been controlled when he checks at home.   Past Medical History:  Diagnosis Date   HLD (hyperlipidemia)    OSA (obstructive sleep apnea)    Syncope and collapse     No past surgical history on file.  Current Medications: Current Meds  Medication Sig   famotidine (PEPCID) 10 MG tablet Take 10 mg by mouth 2 (two) times daily.   Melatonin 3 MG CAPS Take 3 mg by mouth at bedtime.   rosuvastatin (CRESTOR) 20 MG tablet Take 20 mg by mouth daily.   Simethicone (BICARSIM FORTE) 125 MG TABS Take by mouth.   Tadalafil (CIALIS) 2.5 MG TABS Take by mouth.     Allergies:   Penicillins   Social History   Socioeconomic History   Marital status: Married    Spouse name: Not on file   Number of children: Not on file   Years of education: Not on file   Highest education level: Not on file  Occupational History   Not on file  Tobacco Use   Smoking status: Former    Current packs/day: 0.00    Types: Cigarettes    Quit date: 26    Years since quitting: 46.9   Smokeless tobacco: Never   Vaping Use   Vaping status: Never Used  Substance and Sexual Activity   Alcohol use: Not Currently   Drug use: Not Currently   Sexual activity: Yes    Partners: Female    Comment: married  Other Topics Concern   Not on file  Social History Narrative   Not on file   Social Drivers of Health   Financial Resource Strain: Not on file  Food Insecurity: Not on file  Transportation Needs: Not on file  Physical Activity: Not on file  Stress: Not on file  Social Connections: Not on file     Family History: The patient's family history is not on file.  ROS:   Please see the history of present illness.     All other systems reviewed and are negative.  EKGs/Labs/Other Studies Reviewed:    The following studies were reviewed today:   EKG:  EKG is not ordered today.    Recent Labs: No results found for requested labs within last 365 days.  Recent Lipid Panel    Component Value Date/Time   CHOL 167 03/13/2021 0836   TRIG 78 03/13/2021 0836   HDL 53 03/13/2021 0836   CHOLHDL 3.2 03/13/2021 0836   CHOLHDL 3.4 01/21/2010 0646   VLDL 19 01/21/2010 0646  LDLCALC 99 03/13/2021 0836    Physical Exam:    VS:  BP (!) 169/71 (BP Location: Left Arm, Patient Position: Sitting, Cuff Size: Normal)   Pulse 74   Ht 5' 9 (1.753 m)   Wt 160 lb 12.8 oz (72.9 kg)   SpO2 98%   BMI 23.75 kg/m     Wt Readings from Last 3 Encounters:  12/06/23 160 lb 12.8 oz (72.9 kg)  10/03/23 162 lb 6.4 oz (73.7 kg)  08/25/23 155 lb (70.3 kg)     GEN:  Well nourished, well developed in no acute distress HEENT: Normal NECK: No JVD; No carotid bruits CARDIAC: RRR, no murmurs, rubs, gallops RESPIRATORY:  Clear to auscultation without rales, wheezing or rhonchi  ABDOMEN: Soft, non-tender, non-distended MUSCULOSKELETAL:  No edema; No deformity  SKIN: Warm and dry NEUROLOGIC:  Alert and oriented x 3 PSYCHIATRIC:  Normal affect   ASSESSMENT:    1. Elevated coronary artery calcium score   2.  Syncope and collapse   3. Hyperlipidemia with target LDL less than 70   4. Elevated BP reading w/ no diagnosis of HTN    PLAN:    Syncope: Has been seen by EP for recurrent syncopal episodes.  Seen by Dr. 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 Echocardiogram 09/2023 showed EF 55 to 60%, normal RV function, no significant valvular disease. - Reports no recent syncopal episodes  CAD: Calcium score 10/10/2020 was 517 (56 percentile).  - Continue rosuvastatin  Hyperlipidemia: On rosuvastatin 20 mg daily.  LDL 68 on 09/2023. Lp(a) 42.  OSA: On CPAP, reports compliance  Elevated BP: BP elevated in clinic today,  no history of hypertension.  Recommend checking BP twice daily for next week and letting us  know results  RTC in 1 year   Medication Adjustments/Labs and Tests Ordered: Current medicines are reviewed at length with the patient today.  Concerns regarding medicines are outlined above.  No orders of the defined types were placed in this encounter.  No orders of the defined types were placed in this encounter.   Patient Instructions  Medication Instructions:  Your physician recommends that you continue on your current medications as directed. Please refer to the Current Medication list given to you today.  *If you need a refill on your cardiac medications before your next appointment, please call your pharmacy*  Lab Work: none If you have labs (blood work) drawn today and your tests are completely normal, you will receive your results only by: MyChart Message (if you have MyChart) OR A paper copy in the mail If you have any lab test that is abnormal or we need to change your treatment, we will call you to review the results.  Testing/Procedures: none  Follow-Up: At [facility], you and your health needs are our priority.  As part of our continuing mission to provide you with exceptional heart care, our providers are all part  of one team.  This team includes your primary Cardiologist (physician) and Advanced Practice Providers or APPs (Physician Assistants and Nurse Practitioners) who all work together to provide you with the care you need, when you need it.  Your next appointment:  1 year  Provider:   Dr. Kate  We recommend signing up for the patient portal called MyChart.  Sign up information is provided on this After Visit Summary.  MyChart is used to connect with patients for Virtual Visits (Telemedicine).  Patients are able to view lab/test results, encounter  notes, upcoming appointments, etc.  Non-urgent messages can be sent to your provider as well.   To learn more about what you can do with MyChart, go to [email or web address].   Other Instructions Please check blood presuure twice a day for week and send those reading           Signed, Lonni LITTIE Nanas, MD  12/08/2023 1:52 PM    [facility]

## 2023-12-06 ENCOUNTER — Encounter: Payer: Self-pay | Admitting: Cardiology

## 2023-12-06 ENCOUNTER — Ambulatory Visit: Attending: Cardiology | Admitting: Cardiology

## 2023-12-06 VITALS — BP 169/71 | HR 74 | Ht 69.0 in | Wt 160.8 lb

## 2023-12-06 DIAGNOSIS — R030 Elevated blood-pressure reading, without diagnosis of hypertension: Secondary | ICD-10-CM

## 2023-12-06 DIAGNOSIS — R55 Syncope and collapse: Secondary | ICD-10-CM | POA: Diagnosis not present

## 2023-12-06 DIAGNOSIS — E785 Hyperlipidemia, unspecified: Secondary | ICD-10-CM | POA: Diagnosis not present

## 2023-12-06 DIAGNOSIS — R931 Abnormal findings on diagnostic imaging of heart and coronary circulation: Secondary | ICD-10-CM

## 2023-12-06 NOTE — Patient Instructions (Signed)
 Medication Instructions:  Your physician recommends that you continue on your current medications as directed. Please refer to the Current Medication list given to you today.  *If you need a refill on your cardiac medications before your next appointment, please call your pharmacy*  Lab Work: none If you have labs (blood work) drawn today and your tests are completely normal, you will receive your results only by: MyChart Message (if you have MyChart) OR A paper copy in the mail If you have any lab test that is abnormal or we need to change your treatment, we will call you to review the results.  Testing/Procedures: none  Follow-Up: At [facility], you and your health needs are our priority.  As part of our continuing mission to provide you with exceptional heart care, our providers are all part of one team.  This team includes your primary Cardiologist (physician) and Advanced Practice Providers or APPs (Physician Assistants and Nurse Practitioners) who all work together to provide you with the care you need, when you need it.  Your next appointment:  1 year  Provider:   Dr. Kate  We recommend signing up for the patient portal called MyChart.  Sign up information is provided on this After Visit Summary.  MyChart is used to connect with patients for Virtual Visits (Telemedicine).  Patients are able to view lab/test results, encounter notes, upcoming appointments, etc.  Non-urgent messages can be sent to your provider as well.   To learn more about what you can do with MyChart, go to [email or web address].   Other Instructions Please check blood presuure twice a day for week and send those reading

## 2023-12-17 ENCOUNTER — Encounter: Payer: Self-pay | Admitting: Cardiology

## 2024-05-31 ENCOUNTER — Ambulatory Visit: Admitting: Dermatology
